# Patient Record
Sex: Female | Born: 1995 | Race: Black or African American | Hispanic: No | Marital: Single | Smoking: Never smoker
Health system: Southern US, Community
[De-identification: ages and names within clinical notes are randomized; demographics above are authoritative.]

## PROBLEM LIST (undated history)

## (undated) DIAGNOSIS — F419 Anxiety disorder, unspecified: Secondary | ICD-10-CM

## (undated) DIAGNOSIS — F909 Attention-deficit hyperactivity disorder, unspecified type: Secondary | ICD-10-CM

## (undated) HISTORY — PX: TYMPANOSTOMY TUBE PLACEMENT: SHX32

---
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
## 2017-02-01 ENCOUNTER — Ambulatory Visit (HOSPITAL_COMMUNITY)
Admission: EM | Admit: 2017-02-01 | Discharge: 2017-02-01 | Disposition: A | Discharge status: Home / Self Care | Payer: 59 | Attending: Internal Medicine | Admitting: Internal Medicine

## 2017-02-01 ENCOUNTER — Encounter (HOSPITAL_COMMUNITY): Payer: Self-pay | Admitting: Emergency Medicine

## 2017-02-01 DIAGNOSIS — M778 Other enthesopathies, not elsewhere classified: Secondary | ICD-10-CM

## 2017-02-01 MED ORDER — NAPROXEN 500 MG PO TABS: 500.0000 mg | ORAL_TABLET | Freq: Two times a day (BID) | ORAL | 0 refills | Status: DC

## 2017-02-01 NOTE — ED Provider Notes (Signed)
[facility]    CSN: [id number] Arrival date & time: 02/01/17  1715     History   Chief Complaint Chief Complaint  Patient presents with  . Arm Swelling    HPI Jody Singleton is a 21 y.o. female. She presents today with intermittent pain in the ventral ulnar aspect of her right wrist. Current episode has been going on for about a week. Pain is worse with wrist dorsiflexion, no real change with wrist flexion. Intermittently there seems to be some swelling over the ulnar tendons of the wrist area due to the wrist is painful if she rests the ventral surface on anything. She is right handed and tends to text with her right hand. 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 She has not noticed an association of time at work with pain in the wrist. She has tried 200 mg of ibuprofen every other day for her pain, alternating with a Tylenol every other day. She has also tried splinting, all without relief. No rash, no malaise, no fatigue. Feels well otherwise   HPI  Past Medical History:  Diagnosis Date  . Anxiety     Patient Active Problem List   Diagnosis Date Noted  . Pre-diabetes 09/03/2015    History reviewed. No pertinent surgical history.    Home Medications    Prior to Admission medications   Medication Sig Start Date End Date Taking? Authorizing Provider  modafinil (PROVIGIL) 100 MG tablet Take 100 mg by mouth daily.   Yes Historical Provider, MD  sertraline (ZOLOFT) 50 MG tablet Take 50 mg by mouth daily.   Yes Historical Provider, MD  naproxen (NAPROSYN) 500 MG tablet Take 1 tablet (500 mg total) by mouth 2 (two) times daily. 02/01/17   Eustace MooreLaura W Cyncere Ruhe, MD    Family History Family History  Problem Relation Age of Onset  . Kidney disease Mother   . Diabetes Father     Social History Social History  Substance Use Topics  . Smoking status: Never Smoker  . Smokeless tobacco: Never Used  . Alcohol use No     Allergies   Penicillins   Review of  Systems Review of Systems  All other systems reviewed and are negative.    Physical Exam Triage Vital Signs ED Triage Vitals  Enc Vitals Group     BP 02/01/17 1804 118/68     Pulse Rate 02/01/17 1804 90     Resp 02/01/17 1804 18     Temp 02/01/17 1804 98.3 F (36.8 C)     Temp Source 02/01/17 1804 Oral     SpO2 02/01/17 1804 100 %     Weight --      Height --      Pain Score 02/01/17 1803 6     Pain Loc --    Updated Vital Signs BP 118/68 (BP Location: Left Arm)   Pulse 90   Temp 98.3 F (36.8 C) (Oral)   Resp 18   SpO2 100%   Physical Exam  Constitutional: She is oriented to person, place, and time. No distress.  Alert, nicely groomed  HENT:  Head: Atraumatic.  Eyes:  Conjugate gaze, no eye redness/drainage  Neck: Neck supple.  Cardiovascular: Normal rate.   Pulmonary/Chest: No respiratory distress.  Abdominal: She exhibits no distension.  Musculoskeletal: Normal range of motion.  No leg swelling Good wrist range of motion, able to fully extend, flex, rotate her wrist, but with pain at dorsiflexion. Able to make a  tight fist and fully extend the fingers Tender to palpation over the ulnar flexor tendons at the wrist. No rash, no focal swelling/erythema/bruise.  Neurological: She is alert and oriented to person, place, and time.  Skin: Skin is warm and dry.  No cyanosis  Nursing note and vitals reviewed.    UC Treatments / Results   Procedures Procedures (including critical care time) None today  Final Clinical Impressions(s) / UC Diagnoses   Final diagnoses:  Right wrist tendinitis   Pain in left wrist seems most likely to be related to flexor tendinitis.  A prescription for naproxen (anti inflammatory, pain medicine) was sent to the [facility] on [address].  Try using ice for 5-10 minutes 2-4 times daily and avoid texting with the right hand.  Followup with Dr Duanne Guess in 2 weeks to discuss any further evaluation needed.    New Prescriptions New  Prescriptions   NAPROXEN (NAPROSYN) 500 MG TABLET    Take 1 tablet (500 mg total) by mouth 2 (two) times daily.     Eustace Moore, MD 02/03/17 2130

## 2017-02-01 NOTE — Discharge Instructions (Addendum)
Pain in left wrist seems most likely to be related to flexor tendinitis.  A prescription for naproxen (anti inflammatory, pain medicine) was sent to the [facility] on [address].  Try using ice for 5-10 minutes 2-4 times daily and avoid texting with the right hand.  Followup with Dr Duanne Guessewey in 2 weeks to discuss any further evaluation needed.

## 2017-02-01 NOTE — ED Triage Notes (Signed)
[facility] a [facility] right arm pain and swelling x 2 months. The patient denied any known injury.

## 2017-04-14 ENCOUNTER — Ambulatory Visit (INDEPENDENT_AMBULATORY_CARE_PROVIDER_SITE_OTHER): Payer: 59 | Admitting: Student

## 2017-04-14 ENCOUNTER — Encounter: Payer: Self-pay | Admitting: Student

## 2017-04-14 DIAGNOSIS — R319 Hematuria, unspecified: Secondary | ICD-10-CM | POA: Insufficient documentation

## 2017-04-14 DIAGNOSIS — R300 Dysuria: Secondary | ICD-10-CM | POA: Diagnosis not present

## 2017-04-14 LAB — POCT URINALYSIS DIP (MANUAL ENTRY)
BILIRUBIN UA: NEGATIVE
GLUCOSE UA: NEGATIVE mg/dL
Nitrite, UA: NEGATIVE
Protein Ur, POC: 100 mg/dL — AB
Spec Grav, UA: >=1.03 — AB (ref 1.010–1.025)
Urobilinogen, UA: 0.2 E.U./dL
pH, UA: 5.5 (ref 5.0–8.0)

## 2017-04-14 LAB — POC MICROSCOPIC URINALYSIS (UMFC): Mucus: ABSENT

## 2017-04-14 MED ORDER — NITROFURANTOIN MONOHYD MACRO 100 MG PO CAPS: 100.0000 mg | ORAL_CAPSULE | Freq: Two times a day (BID) | ORAL | 0 refills | Status: DC

## 2017-04-14 NOTE — Assessment & Plan Note (Addendum)
Could be vaginal source vs urinary source.  Has Nexplanon.  With symptoms and blood/LE in urine, will treat with macrobid and get urine culture.  Follow up if not improved and will consider making urology referral more urgent.  Will also do so if urine culture is negative.  Symptoms worsening.

## 2017-04-14 NOTE — Progress Notes (Signed)
   Subjective:    Patient ID: Jody Singleton, female    DOB: 02-13-1996, 21 y.o.   MRN: [id number]  HPI Presents with 1 month history of hematuria.  She did not notice this until a week or so ago.  Now she notices hematuria and dysuria occasionally.  She denies hesitancy or frequency.  Denies fevers or chills.  States that her mom had kidney failure while giving birth to her twins.  She is not sure the cause.  Pt is not sexually active.  She had labs drawn last visit and her PCP stated that her creatinine was elevated.  She is not concerned about STD's.     PMHx - Updated and reviewed.  Contributory factors include: depression PSHx - Updated and reviewed.  Contributory factors include:  Negative FHx - Updated and reviewed.  Contributory factors include:  Negative Social Hx - Updated and reviewed. Contributory factors include: Negative Medications - reviewed   Review of Systems  Constitutional: Negative for chills, fatigue and fever.  HENT: Negative for congestion and rhinorrhea.   Respiratory: Negative for cough, chest tightness and shortness of breath.   Cardiovascular: Negative for chest pain and leg swelling.  Gastrointestinal: Negative for abdominal pain and nausea.  Genitourinary: Positive for dysuria and hematuria. Negative for decreased urine volume, difficulty urinating, flank pain, frequency, pelvic pain, urgency, vaginal discharge and vaginal pain.  Musculoskeletal: Negative for arthralgias and joint swelling.  Skin: Negative for rash and wound.  Psychiatric/Behavioral: Negative for agitation and confusion.  All other systems reviewed and are negative.      Objective:   Physical Exam  Constitutional: She is oriented to person, place, and time. She appears well-developed and well-nourished. No distress.  HENT:  Head: Normocephalic and atraumatic.  Right Ear: External ear normal.  Left Ear: External ear normal.  Neck: Normal range of motion. Neck supple.  Pulmonary/Chest:  Effort normal. No respiratory distress.  Abdominal: Soft. Bowel sounds are normal. She exhibits no distension. There is no tenderness. There is no rebound and no guarding.  Genitourinary: Vagina normal. No vaginal discharge found.  Genitourinary Comments: No bleeding in vaginal vault  Musculoskeletal: Normal range of motion. She exhibits no edema.  Lymphadenopathy:    She has no cervical adenopathy.  Neurological: She is alert and oriented to person, place, and time.  Skin: Skin is warm. No rash noted. She is not diaphoretic. No erythema.  Psychiatric: She has a normal mood and affect. Her behavior is normal. Judgment and thought content normal.  Nursing note and vitals reviewed.   BP 121/75   Pulse 98   Temp 98 F (36.7 C) (Oral)   Resp 17   Ht 5\' 4"  (1.626 m)   Wt 243 lb (110.2 kg)   LMP 03/31/2017 (Approximate)   SpO2 97%   BMI 41.71 kg/m        Assessment & Plan:  Hematuria Could be vaginal source vs urinary source.  Has Nexplanon.  With symptoms and blood/LE in urine, will treat with macrobid and get urine culture.  Follow up if not improved and will consider making urology referral more urgent.  Will also do so if urine culture is negative.  Symptoms worsening.  Signed,  Corliss MarcusAlicia Barnes, DO [facility] 6:07 PM 04/14/17

## 2017-04-15 LAB — COMPREHENSIVE METABOLIC PANEL
ALT: 17 IU/L (ref 0–32)
AST: 30 IU/L (ref 0–40)
Albumin/Globulin Ratio: 1.3 (ref 1.2–2.2)
Albumin: 4.2 g/dL (ref 3.5–5.5)
Alkaline Phosphatase: 55 IU/L (ref 39–117)
BILIRUBIN TOTAL: 0.5 mg/dL (ref 0.0–1.2)
BUN / CREAT RATIO: 16 (ref 9–23)
BUN: 14 mg/dL (ref 6–20)
CALCIUM: 9 mg/dL (ref 8.7–10.2)
CHLORIDE: 91 mmol/L — AB (ref 96–106)
CO2: 22 mmol/L (ref 18–29)
Creatinine, Ser: 0.86 mg/dL (ref 0.57–1.00)
GFR, EST AFRICAN AMERICAN: 113 mL/min/{1.73_m2} (ref >59)
GFR, EST NON AFRICAN AMERICAN: 98 mL/min/{1.73_m2} (ref >59)
GLOBULIN, TOTAL: 3.2 g/dL (ref 1.5–4.5)
Glucose: 94 mg/dL (ref 65–99)
Potassium: 3.7 mmol/L (ref 3.5–5.2)
SODIUM: 132 mmol/L — AB (ref 134–144)
TOTAL PROTEIN: 7.4 g/dL (ref 6.0–8.5)

## 2017-04-15 LAB — CBC WITH DIFFERENTIAL/PLATELET
Basophils Absolute: 0 10*3/uL (ref 0.0–0.2)
Basos: 0 %
EOS (ABSOLUTE): 0.2 10*3/uL (ref 0.0–0.4)
Eos: 2 %
HEMATOCRIT: 38 % (ref 34.0–46.6)
Hemoglobin: 12.3 g/dL (ref 11.1–15.9)
IMMATURE GRANS (ABS): 0 10*3/uL (ref 0.0–0.1)
IMMATURE GRANULOCYTES: 0 %
Lymphocytes Absolute: 2.6 10*3/uL (ref 0.7–3.1)
Lymphs: 26 %
MCH: 24.3 pg — AB (ref 26.6–33.0)
MCHC: 32.4 g/dL (ref 31.5–35.7)
MCV: 75 fL — AB (ref 79–97)
MONOS ABS: 0.6 10*3/uL (ref 0.1–0.9)
Monocytes: 6 %
NEUTROS ABS: 6.6 10*3/uL (ref 1.4–7.0)
NEUTROS PCT: 66 %
PLATELETS: 389 10*3/uL — AB (ref 150–379)
RBC: 5.06 x10E6/uL (ref 3.77–5.28)
RDW: 16.2 % — ABNORMAL HIGH (ref 12.3–15.4)
WBC: 10 10*3/uL (ref 3.4–10.8)

## 2017-04-16 LAB — URINE CULTURE

## 2017-04-21 ENCOUNTER — Telehealth: Payer: Self-pay | Admitting: Student

## 2017-04-21 ENCOUNTER — Telehealth: Payer: Self-pay | Admitting: Family Medicine

## 2017-04-21 DIAGNOSIS — R310 Gross hematuria: Secondary | ICD-10-CM

## 2017-04-21 NOTE — Telephone Encounter (Signed)
Please see my phone note- Urgent urology referral, no UTI.  No abnormal kidney function.

## 2017-04-21 NOTE — Telephone Encounter (Signed)
Pt calling Dr Rory Percyhitanand back about labs

## 2017-04-21 NOTE — Telephone Encounter (Signed)
Left VM for patient. If she calls back please have her speak with a nurse/CMA and tell her that her lab/urine results do not show a UTI and do not show kidney disorders.  She is not anemic at this time.  I will place a more urgent urology referral since this has become worse and it was not a UTI.     If any questions then please take the best time and phone number to call and I will try to call her back.   Glenard Haringhitanand, Alicia B, DO PGY-4, [facility] 04/21/2017, 11:42 AM

## 2017-04-21 NOTE — Telephone Encounter (Signed)
Please advise 

## 2017-04-22 NOTE — Telephone Encounter (Signed)
Left VM for patient. If she calls back please have her speak with a nurse/CMA and tell her that her lab/urine results do not show a UTI and do not show kidney disorders.  She is not anemic at this time.  I will place a more urgent urology referral since this has become worse and it was not a UTI.     If any questions then please take the best time and phone number to call and I will try to call her back.   Glenard Haringhitanand, Alicia B, DO PGY-4, [facility] 04/21/2017, 11:42 AM   L/m with dr advice and referral info

## 2017-04-22 NOTE — Telephone Encounter (Signed)
Urgent Urology referral sent on 5/23 to [facility] per provider's request- okay to send per Clydie BraunKaren from PCP

## 2018-07-24 ENCOUNTER — Ambulatory Visit: Payer: 59 | Admitting: Osteopathic Medicine

## 2018-07-24 ENCOUNTER — Encounter: Payer: Self-pay | Admitting: Osteopathic Medicine

## 2018-07-24 ENCOUNTER — Other Ambulatory Visit: Payer: Self-pay

## 2018-07-24 VITALS — BP 135/89 | HR 89 | Temp 98.0°F | Resp 16 | Ht 63.98 in | Wt 236.0 lb

## 2018-07-24 DIAGNOSIS — J029 Acute pharyngitis, unspecified: Secondary | ICD-10-CM | POA: Diagnosis not present

## 2018-07-24 MED ORDER — PREDNISONE 20 MG PO TABS: 20.0000 mg | ORAL_TABLET | Freq: Two times a day (BID) | ORAL | 0 refills | Status: DC

## 2018-07-24 MED ORDER — LIDOCAINE VISCOUS HCL 2 % MT SOLN: 15.0000 mL | OROMUCOSAL | 1 refills | Status: DC | PRN

## 2018-07-24 NOTE — Progress Notes (Signed)
HPI: Jody Singleton is a 22 y.o. female who  has a past medical history of Anxiety.  [facility], 07/24/18,  for chief complaint of: Sore throat  . Location/Quality: throat, sore. Hurts a little to swallow . Duration: 1 week  . Modifying factors: DayQuil not helping  . Assoc signs/symptoms: no fever/cihlls, sinus pressure, cough  Context: started after oral sex, w/ 2 moments of deeper penetration.      Past medical, surgical, social and family history reviewed:  Patient Active Problem List   Diagnosis Date Noted  . Hematuria 04/14/2017  . Pre-diabetes 09/03/2015    History reviewed. No pertinent surgical history.  Social History   Tobacco Use  . Smoking status: Never Smoker  . Smokeless tobacco: Never Used  Substance Use Topics  . Alcohol use: No    Alcohol/week: 0.0 standard drinks    Family History  Problem Relation Age of Onset  . Kidney disease Mother   . Diabetes Father      Current medication list and allergy/intolerance information reviewed:    No current outpatient medications on file.   No current facility-administered medications for this visit.     Allergies  Allergen Reactions  . Penicillins Hives          Review of Systems:  Constitutional:  No  fever, no chills, No recent illness, No unintentional weight changes. No significant fatigue.   HEENT: No  headache, no vision change, no hearing change, +sore throat, No  sinus pressure  Cardiac: No  chest pain, No  pressure, No palpitations  Respiratory:  No  shortness of breath. No  Cough  Gastrointestinal: No  abdominal pain, No  nausea, No  vomiting,  No  blood in stool, No  diarrhea  Musculoskeletal: No new myalgia/arthralgia  Skin: No  Rash  Neurologic: No  weakness, No  dizziness  Exam:  BP 135/89   Pulse 89   Temp 98 F (36.7 C) (Oral)   Resp 16   Ht 5' 3.98" (1.625 m)   Wt 236 lb (107 kg)   SpO2 97%   BMI 40.54 kg/m    Constitutional: VS see above. General Appearance: alert, well-developed, well-nourished, NAD  Eyes: Normal lids and conjunctive, non-icteric sclera  Ears, Nose, Mouth, Throat: MMM, Normal external inspection ears/nares/mouth/lips/gums. TM normal bilaterally. Pharynx/tonsils no erythema, no exudate. Nasal mucosa normal.   Neck: No masses, trachea midline. No tenderness/mass appreciated. No lymphadenopathy  Respiratory: Normal respiratory effort. no wheeze, no rhonchi, no rales  Cardiovascular: S1/S2 normal, no murmur, no rub/gallop auscultated. RRR. No lower extremity edema.   Gastrointestinal: Nontender, no masses. Bowel sounds normal.  Musculoskeletal: Gait normal.   Neurological: Normal balance/coordination. No tremor.   Skin: warm, dry, intact.   Psychiatric: Normal judgment/insight. Normal mood and affect.    ASSESSMENT/PLAN:  Sore throat   Likely traumatic, see HPI.  Discussed safe sexual practices, consent, ensured safety.  Declines STI testing right now, advised RTC for this if no better or if other concerning symptoms.      Visit summary with medication list and pertinent instructions was printed for patient to review. All questions at time of visit were answered - patient instructed to contact office with any additional concerns. ER/RTC precautions were reviewed with the patient.   Follow-up plan: Return for recheck or further testing if not better in the next week or so, sooner if worse/change .    Please note: voice recognition software was used  to produce this document, and typos may escape review. Please contact Dr. Lyn Hollingshead for any needed clarifications.

## 2018-07-24 NOTE — Patient Instructions (Signed)
° ° ° °  If you have lab work done today you will be contacted with your lab results within the next 2 weeks.  If you have not heard from us then please contact us. The fastest way to get your results is to register for My Chart. ° ° °IF you received an x-ray today, you will receive an invoice from [facility]. Please contact [facility] at [phone] with questions or concerns regarding your invoice.  ° °IF you received labwork today, you will receive an invoice from [facility]. Please contact [facility] at [phone] with questions or concerns regarding your invoice.  ° °Our billing staff will not be able to assist you with questions regarding bills from these companies. ° °You will be contacted with the lab results as soon as they are available. The fastest way to get your results is to activate your My Chart account. Instructions are located on the last page of this paperwork. If you have not heard from us regarding the results in 2 weeks, please contact this office. °  ° ° ° °

## 2018-08-01 ENCOUNTER — Encounter (HOSPITAL_COMMUNITY): Payer: Self-pay

## 2018-08-01 ENCOUNTER — Ambulatory Visit (HOSPITAL_COMMUNITY)
Admission: EM | Admit: 2018-08-01 | Discharge: 2018-08-01 | Disposition: A | Discharge status: Home / Self Care | Payer: 59 | Attending: Family Medicine | Admitting: Family Medicine

## 2018-08-01 ENCOUNTER — Other Ambulatory Visit: Payer: Self-pay

## 2018-08-01 DIAGNOSIS — J039 Acute tonsillitis, unspecified: Secondary | ICD-10-CM | POA: Insufficient documentation

## 2018-08-01 DIAGNOSIS — J029 Acute pharyngitis, unspecified: Secondary | ICD-10-CM | POA: Diagnosis present

## 2018-08-01 LAB — POCT RAPID STREP A: Streptococcus, Group A Screen (Direct): NEGATIVE

## 2018-08-01 MED ORDER — NAPROXEN 375 MG PO TABS: 375.0000 mg | ORAL_TABLET | Freq: Two times a day (BID) | ORAL | 0 refills | Status: DC

## 2018-08-01 NOTE — Discharge Instructions (Addendum)
Decline STI testing at this time Continue with viscous lidocaine Naproxen prescribed.  Take as needed for symptomatic relief Follow up with [facility] or with PCP this week for further evaluation and management of symptoms Return or go to ER if patient has any new or worsening symptoms

## 2018-08-01 NOTE — ED Notes (Signed)
Bed: UC04 Expected date:  Expected time:  Means of arrival:  Comments: Hold for 405 appt

## 2018-08-01 NOTE — ED Provider Notes (Signed)
[facility]   [id number] 08/01/18 Arrival Time: 1558  [id number] THROAT  SUBJECTIVE: History from: patient.  Jody Singleton is a 22 y.o. female who presents with sore throat x 2 weeks.  Symptoms began after performing oral sex  Was seen at [facility] and treated with prednisone and lidocaine.  Was instructed to follow up with [facility] if symptoms did not improve.  Denies improvement in symptoms with treatment.  Denies aggravating factors  Patient complains of constant and dull pain.  Denies previous symptoms in the past.  Complains of fatigue, and dysphagia, but tolerating liquids and own secretions without difficulty.  Denies fever, chills, sinus pain, rhinorrhea, cough, SOB, wheezing, chest pain, nausea, rash, changes in bowel or bladder habits.     ROS: As per HPI.  Past Medical History:  Diagnosis Date  . Anxiety    History reviewed. No pertinent surgical history. Allergies  Allergen Reactions  . Penicillins Hives        No current facility-administered medications on file prior to encounter.    Current Outpatient Medications on File Prior to Encounter  Medication Sig Dispense Refill  . lidocaine (XYLOCAINE) 2 % solution Use as directed 15 mLs in the mouth or throat every 3 (three) hours as needed for mouth pain (throat pain). 100 mL 1  . predniSONE (DELTASONE) 20 MG tablet Take 1 tablet (20 mg total) by mouth 2 (two) times daily with a meal. 10 tablet 0   Social History   Socioeconomic History  . Marital status: Single    Spouse name: Not on file  . Number of children: Not on file  . Years of education: Not on file  . Highest education level: Not on file  Occupational History  . Not on file  Social Needs  . Financial resource strain: Not on file  . Food insecurity:    Worry: Not on file    Inability: Not on file  . Transportation needs:    Medical: Not on file    Non-medical: Not on file  Tobacco Use  . Smoking status: Never Smoker  . Smokeless tobacco:  Never Used  Substance and Sexual Activity  . Alcohol use: No    Alcohol/week: 0.0 standard drinks  . Drug use: No  . Sexual activity: Never  Lifestyle  . Physical activity:    Days per week: Not on file    Minutes per session: Not on file  . Stress: Not on file  Relationships  . Social connections:    Talks on phone: Not on file    Gets together: Not on file    Attends religious service: Not on file    Active member of club or organization: Not on file    Attends meetings of clubs or organizations: Not on file    Relationship status: Not on file  . Intimate partner violence:    Fear of current or ex partner: Not on file    Emotionally abused: Not on file    Physically abused: Not on file    Forced sexual activity: Not on file  Other Topics Concern  . Not on file  Social History Narrative  . Not on file   Family History  Problem Relation Age of Onset  . Kidney disease Mother   . Diabetes Father     OBJECTIVE:  Vitals:   08/01/18 1609  BP: 129/85  Pulse: 90  Resp: 18  Temp: 97.9 F (36.6 C)  SpO2: 98%     General  appearance: alert; nontoxic appearance; using mobile device without difficulty HEENT: Ears: EACs clear, TMs pearly gray with visible cone of light, without erythema; Eyes: PERRL, EOMI grossly; Nose: no clear rhinorrhea; Throat: tonsils 2-3+ non-erythematous without white tonsillar exudates, uvula midline Neck: supple without LAD Lungs: CTA bilaterally without adventitious breath sounds Heart: regular rate and rhythm.  Radial pulses 2+ symmetrical bilaterally Skin: warm and dry Psychological: alert and cooperative; normal mood and affect  ASSESSMENT & PLAN:  1. Tonsillitis   2. Sore throat     Meds ordered this encounter  Medications  . naproxen (NAPROSYN) 375 MG tablet    Sig: Take 1 tablet (375 mg total) by mouth 2 (two) times daily.    Dispense:  20 tablet    Refill:  0    Order Specific Question:   Supervising Provider    Answer:   Isa Rankin [[id number]]    Decline STI testing at this time Continue with viscous lidocaine Naproxen prescribed.  Take as needed for symptomatic relief Follow up with [facility] this week for further evaluation and management of symptoms Return or go to ER if patient has any new or worsening symptoms   Reviewed expectations re: course of current medical issues. Questions answered. Outlined signs and symptoms indicating need for more acute intervention. Patient verbalized understanding. After Visit Summary given.        Jody Harding, PA-C 08/01/18 1637

## 2018-08-01 NOTE — ED Triage Notes (Signed)
Pt states she has a sore throat x 2 weeks.

## 2018-08-04 LAB — CULTURE, GROUP A STREP (THRC)

## 2019-10-06 ENCOUNTER — Ambulatory Visit (INDEPENDENT_AMBULATORY_CARE_PROVIDER_SITE_OTHER): Payer: 59 | Admitting: Obstetrics and Gynecology

## 2019-10-06 ENCOUNTER — Other Ambulatory Visit: Payer: Self-pay

## 2019-10-06 ENCOUNTER — Encounter: Payer: Self-pay | Admitting: Obstetrics and Gynecology

## 2019-10-06 VITALS — BP 125/85 | HR 99 | Temp 98.6°F | Ht 63.0 in | Wt 250.6 lb

## 2019-10-06 DIAGNOSIS — Z3046 Encounter for surveillance of implantable subdermal contraceptive: Secondary | ICD-10-CM

## 2019-10-06 NOTE — Progress Notes (Signed)
Ms. EARLISHA SHARPLES is a No obstetric history on file. female in the office today for removal of Nexplanon; which was inserted 2017 at unknown GYN office in [place], [place]. She does not desire to have it replaced today. She reports not being sexually active in 3 yrs. She plans to use condoms, if she resumes sexual activity. She has no complaints today.  BP 125/85 (BP Location: Right Arm, Patient Position: Sitting, Cuff Size: Large)   Pulse 99   Temp 98.6 F (37 C) (Oral)   Ht 5\' 3"  (1.6 m)   Wt 250 lb 9.6 oz (113.7 kg)   LMP 09/19/2019 (Exact Date)   BMI 44.39 kg/m    Procedure Note: Consent obtained and Time-Out conducted Unable to palpate Implant in left upper arm Informal U/S by CNM to attempt to locate Nexplanon location in arm - unsuccessful attempt  Assessment and Plan: Encounter for Nexplanon removal - Unable to locate Nexplanon - Consult with Dr. Sherryl Barters -- advised to order limited soft tissue u/s to find implant and add a note to have site marked with a Sharpie - Nexplanon removal appt scheduled at [place] with MD - U/S to be scheduled before removal appt on same day, if possible  Time spent with patient 10 minutes.  Laury Deep, CNM  10/06/2019 9:28 AM

## 2019-10-10 ENCOUNTER — Telehealth: Payer: Self-pay | Admitting: General Practice

## 2019-10-10 NOTE — Telephone Encounter (Signed)
Pt scheduled for [place] on 10/24/2019 at 8:00am.  Left message on VM with appt date and time of appt.  Asked pt to give our office a call.

## 2019-10-24 ENCOUNTER — Other Ambulatory Visit: Payer: Self-pay

## 2019-10-24 ENCOUNTER — Ambulatory Visit: Payer: 59 | Admitting: Obstetrics and Gynecology

## 2019-10-24 ENCOUNTER — Encounter: Payer: Self-pay | Admitting: Obstetrics and Gynecology

## 2019-10-24 ENCOUNTER — Ambulatory Visit (HOSPITAL_COMMUNITY)
Admission: RE | Admit: 2019-10-24 | Discharge: 2019-10-24 | Disposition: A | Discharge status: Home / Self Care | Payer: 59 | Source: Ambulatory Visit | Attending: Obstetrics and Gynecology | Admitting: Obstetrics and Gynecology

## 2019-10-24 VITALS — BP 123/82 | HR 91 | Wt 253.8 lb

## 2019-10-24 DIAGNOSIS — Z3046 Encounter for surveillance of implantable subdermal contraceptive: Secondary | ICD-10-CM

## 2019-10-24 NOTE — Procedures (Signed)
Nexplanon Removal Procedure Note Prior to the procedure being performed, the patient (or guardian) was asked to state their full name, date of birth, type of procedure being performed and the exact location of the operative site. This information was then checked against the documentation in the patient's chart. Prior to the procedure being performed, a "time out" was performed by the physician that confirmed the correct patient, procedure and site.  After informed consent was obtained, the patient's left arm was examined. The area was marked from her u/s earlier today. The distal tip was palpable with pressure.The area was cleaned with alcohol then local anesthesia was infiltrated with 3 ml of 1% lidocaine with epi. The area was prepped with betadine. Using sterile technique, the Nexplanon device was brought to the incision site. The capsule was scrapped off with the scalpel, the Nexplanon grasped with hemostats, and easily removed; the removal site was hemostatic. The Nexplanon was inspected and noted to be intact.  A steri-strip and a pressure dressing was applied.  The patient tolerated the procedure well.  She chose to do abstinence for birth control.   Durene Romans MD Attending Center for [employer] Fish farm manager)

## 2019-11-01 ENCOUNTER — Encounter: Payer: Self-pay | Admitting: *Deleted

## 2019-12-13 ENCOUNTER — Ambulatory Visit: Payer: 59 | Attending: Internal Medicine

## 2019-12-13 DIAGNOSIS — Z20822 Contact with and (suspected) exposure to covid-19: Secondary | ICD-10-CM

## 2019-12-14 ENCOUNTER — Other Ambulatory Visit: Payer: Self-pay

## 2019-12-14 ENCOUNTER — Ambulatory Visit
Admission: EM | Admit: 2019-12-14 | Discharge: 2019-12-14 | Disposition: A | Discharge status: Home / Self Care | Payer: 59 | Attending: Physician Assistant | Admitting: Physician Assistant

## 2019-12-14 DIAGNOSIS — R110 Nausea: Secondary | ICD-10-CM | POA: Diagnosis not present

## 2019-12-14 DIAGNOSIS — R52 Pain, unspecified: Secondary | ICD-10-CM | POA: Diagnosis not present

## 2019-12-14 DIAGNOSIS — Z0189 Encounter for other specified special examinations: Secondary | ICD-10-CM

## 2019-12-14 DIAGNOSIS — J029 Acute pharyngitis, unspecified: Secondary | ICD-10-CM

## 2019-12-14 DIAGNOSIS — Z3202 Encounter for pregnancy test, result negative: Secondary | ICD-10-CM

## 2019-12-14 LAB — POCT URINE PREGNANCY: Preg Test, Ur: NEGATIVE

## 2019-12-14 LAB — NOVEL CORONAVIRUS, NAA: SARS-CoV-2, NAA: NOT DETECTED

## 2019-12-14 MED ORDER — LIDOCAINE VISCOUS HCL 2 % MT SOLN: OROMUCOSAL | 0 refills | Status: DC

## 2019-12-14 MED ORDER — ONDANSETRON 4 MG PO TBDP: 4.0000 mg | ORAL_TABLET | Freq: Three times a day (TID) | ORAL | 0 refills | Status: AC | PRN

## 2019-12-14 NOTE — ED Triage Notes (Signed)
Pt presents with complaints of headache, fatigue, nausea, sore throat and body aches x 1 week. Denies any exposure to covid. Denies any other symptoms. Reports minimal relief with otc medications.

## 2019-12-14 NOTE — ED Provider Notes (Signed)
[facility]    CSN: [id number] Arrival date & time: 12/14/19  1620      History   Chief Complaint Chief Complaint  Patient presents with  . Appointment  . Fatigue    HPI Jody Singleton is a 24 y.o. female.   24 year old female comes in for 7 day of URI symptoms. Has had headache, fatigue, nausea, sore throat, body aches. Minimal cough that has resolved. no rhinorrhea, nasal congestion. Denies fever, chills. Nausea without vomiting. Denies abdominal pain, diarrhea. Denies shortness of breath, loss of taste/smell. No obvious sick contact. She would like pregnancy test and COVID test.      Past Medical History:  Diagnosis Date  . Anxiety     Patient Active Problem List   Diagnosis Date Noted  . Hematuria 04/14/2017  . Pre-diabetes 09/03/2015    History reviewed. No pertinent surgical history.  OB History   No obstetric history on file.      Home Medications    Prior to Admission medications   Medication Sig Start Date End Date Taking? Authorizing Provider  lidocaine (XYLOCAINE) 2 % solution 5-15 mL gurgle as needed 12/14/19   Tasia Catchings, Annalyssa Thune V, PA-C  ondansetron (ZOFRAN ODT) 4 MG disintegrating tablet Take 1 tablet (4 mg total) by mouth every 8 (eight) hours as needed for nausea or vomiting. 12/14/19   Ok Edwards, PA-C    Family History Family History  Problem Relation Age of Onset  . Kidney disease Mother   . Diabetes Father     Social History Social History   Tobacco Use  . Smoking status: Never Smoker  . Smokeless tobacco: Never Used  Substance Use Topics  . Alcohol use: No    Alcohol/week: 0.0 standard drinks  . Drug use: No     Allergies   Penicillins   Review of Systems Review of Systems  Reason unable to perform ROS: See HPI as above.     Physical Exam Triage Vital Signs ED Triage Vitals [12/14/19 1652]  Enc Vitals Group     BP      Pulse      Resp      Temp      Temp src      SpO2      Weight      Height      Head  Circumference      Peak Flow      Pain Score 7     Pain Loc      Pain Edu?      Excl. in [place]?    No data found.  Updated Vital Signs BP 103/71   Pulse 90   Temp 98.2 F (36.8 C)   Resp 18   SpO2 98%   Physical Exam Constitutional:      General: She is not in acute distress.    Appearance: Normal appearance. She is not ill-appearing, toxic-appearing or diaphoretic.  HENT:     Head: Normocephalic and atraumatic.     Mouth/Throat:     Mouth: Mucous membranes are moist.     Pharynx: Oropharynx is clear. Uvula midline.  Cardiovascular:     Rate and Rhythm: Normal rate and regular rhythm.     Heart sounds: Normal heart sounds. No murmur. No friction rub. No gallop.   Pulmonary:     Effort: Pulmonary effort is normal. No accessory muscle usage, prolonged expiration, respiratory distress or retractions.     Comments: Lungs clear to auscultation  without adventitious lung sounds. Musculoskeletal:     Cervical back: Normal range of motion and neck supple.  Skin:    General: Skin is warm and dry.  Neurological:     General: No focal deficit present.     Mental Status: She is alert and oriented to person, place, and time.      UC Treatments / Results  Labs (all labs ordered are listed, but only abnormal results are displayed) Labs Reviewed  NOVEL CORONAVIRUS, NAA  POCT URINE PREGNANCY    EKG   Radiology No results found.  Procedures Procedures (including critical care time)  Medications Ordered in UC Medications - No data to display  Initial Impression / Assessment and Plan / UC Course  I have reviewed the triage vital signs and the nursing notes.  Pertinent labs & imaging results that were available during my care of the patient were reviewed by me and considered in my medical decision making (see chart for details).    COVID testing ordered and collected prior to provider evaluation. Upon chart review, patient has had 2 COVID tests, one rapid, one PCR done  12/13/2019. Both were negative. Will not retest at this time. Order was unable to be canceled on Epic. Sample NOT sent out.  Will provide pregnancy testing per patient's request. nexplanon removed 10/2019, has not had cycle. Pregnancy test negative. Will provide symptomatic treatment for now and to continue to monitor. Return precautions given.  Final Clinical Impressions(s) / UC Diagnoses   Final diagnoses:  Patient request for diagnostic testing  Sore throat  Body aches  Nausea without vomiting   ED Prescriptions    Medication Sig Dispense Auth. Provider   lidocaine (XYLOCAINE) 2 % solution 5-15 mL gurgle as needed 150 mL Tyteanna Ost V, PA-C   ondansetron (ZOFRAN ODT) 4 MG disintegrating tablet Take 1 tablet (4 mg total) by mouth every 8 (eight) hours as needed for nausea or vomiting. 10 tablet Belinda Fisher, PA-C     PDMP not reviewed this encounter.   Belinda Fisher, PA-C 12/14/19 1808

## 2019-12-14 NOTE — Discharge Instructions (Addendum)
Urine pregnancy negative. Continue to monitor symptoms. Start lidocaine for sore throat, do not eat or drink for the next 40 mins after use as it can stunt your gag reflex. Zofran as needed. Tylenol/motrin as needed. Follow up with PCP if symptoms not improving

## 2020-05-05 ENCOUNTER — Other Ambulatory Visit: Payer: Self-pay

## 2020-05-05 ENCOUNTER — Ambulatory Visit
Admission: EM | Admit: 2020-05-05 | Discharge: 2020-05-05 | Disposition: A | Discharge status: Home / Self Care | Payer: 59 | Attending: Emergency Medicine | Admitting: Emergency Medicine

## 2020-05-05 DIAGNOSIS — L02411 Cutaneous abscess of right axilla: Secondary | ICD-10-CM

## 2020-05-05 MED ORDER — DOXYCYCLINE HYCLATE 100 MG PO CAPS: 100.0000 mg | ORAL_CAPSULE | Freq: Two times a day (BID) | ORAL | 0 refills | Status: DC

## 2020-05-05 NOTE — ED Triage Notes (Signed)
Pt presents with abscess under right axilla, that has grown over past week

## 2020-05-05 NOTE — Discharge Instructions (Addendum)
Keep dry and covered to avoid friction  You may then wash site daily with warm water and mild soap Take antibiotic as prescribed and to completion Return sooner or go to the ED if you have any new or worsening symptoms such as increased redness, swelling, pain, nausea, vomiting, fever, chills, etc.

## 2020-05-05 NOTE — ED Provider Notes (Signed)
[facility]   [id number] 05/05/20 Arrival Time: 1227   [id number]  SUBJECTIVE:  Jody Singleton is a 24 y.o. female who presents with a possible abscess of her right axilla. Onset gradual, approximately 1 week ago.  Denies any precipitating event.  Denies alleviating or aggravating factor.  Has not tried any OTC medication.  Denies chills, fever, nausea, vomiting, diarrhea.  ROS: As per HPI.  All other pertinent ROS negative.     Past Medical History:  Diagnosis Date  . Anxiety    No past surgical history on file. Allergies  Allergen Reactions  . Penicillins Hives        No current facility-administered medications on file prior to encounter.   Current Outpatient Medications on File Prior to Encounter  Medication Sig Dispense Refill  . lidocaine (XYLOCAINE) 2 % solution 5-15 mL gurgle as needed 150 mL 0  . ondansetron (ZOFRAN ODT) 4 MG disintegrating tablet Take 1 tablet (4 mg total) by mouth every 8 (eight) hours as needed for nausea or vomiting. 10 tablet 0   Social History   Socioeconomic History  . Marital status: Single    Spouse name: Not on file  . Number of children: Not on file  . Years of education: Not on file  . Highest education level: Not on file  Occupational History  . Not on file  Tobacco Use  . Smoking status: Never Smoker  . Smokeless tobacco: Never Used  Substance and Sexual Activity  . Alcohol use: No    Alcohol/week: 0.0 standard drinks  . Drug use: No  . Sexual activity: Never  Other Topics Concern  . Not on file  Social History Narrative  . Not on file   Social Determinants of Health   Financial Resource Strain:   . Difficulty of Paying Living Expenses:   Food Insecurity:   . Worried About Programme researcher, broadcasting/film/video in the Last Year:   . Barista in the Last Year:   Transportation Needs:   . Freight forwarder (Medical):   Marland Kitchen Lack of Transportation (Non-Medical):   Physical Activity:   . Days of Exercise per Week:    . Minutes of Exercise per Session:   Stress:   . Feeling of Stress :   Social Connections:   . Frequency of Communication with Friends and Family:   . Frequency of Social Gatherings with Friends and Family:   . Attends Religious Services:   . Active Member of Clubs or Organizations:   . Attends Banker Meetings:   Marland Kitchen Marital Status:   Intimate Partner Violence:   . Fear of Current or Ex-Partner:   . Emotionally Abused:   Marland Kitchen Physically Abused:   . Sexually Abused:    Family History  Problem Relation Age of Onset  . Kidney disease Mother   . Diabetes Father     OBJECTIVE:  Vitals:   05/05/20 1234  BP: 112/82  Pulse: 81  Resp: 20  Temp: 98.1 F (36.7 C)  SpO2: 98%     General appearance: alert; no distress Skin:4 cm induration of her right axilla; tender to touch; no active drainage Psychological: alert and cooperative; normal mood and affect  Procedure: Verbal consent obtained. Area over induration cleaned with betadine. Lidocaine 2% with epinephrine used to obtain local anesthesia. The most fluctuant portion of the abscess was incised with a #11 blade scalpel. Abscess cavity explored and evacuated. Loculations broken up with a curved hemostat as best  as possible given patient discomfort. Cavity packed with packing material and dressed with a clean gauze dressing. Minimal bleeding. No complications.  ASSESSMENT & PLAN:  1. Abscess of axilla, right     Meds ordered this encounter  Medications  . doxycycline (VIBRAMYCIN) 100 MG capsule    Sig: Take 1 capsule (100 mg total) by mouth 2 (two) times daily.    Dispense:  20 capsule    Refill:  0    Discharge instructions.Marland Kitchen    Keep dry and covered to avoid friction  You may then wash site daily with warm water and mild soap Take antibiotic as prescribed and to completion Return sooner or go to the ED if you have any new or worsening symptoms such as increased redness, swelling, pain, nausea, vomiting,  fever, chills, etc...   Reviewed expectations re: course of current medical issues. Questions answered. Outlined signs and symptoms indicating need for more acute intervention. Patient verbalized understanding. After Visit Summary given.          Emerson Monte, FNP 05/05/20 1306

## 2021-09-20 ENCOUNTER — Other Ambulatory Visit: Payer: Self-pay

## 2021-09-20 ENCOUNTER — Ambulatory Visit (HOSPITAL_COMMUNITY): Admit: 2021-09-20 | Discharge status: Absent without leave | Payer: 59

## 2021-09-20 ENCOUNTER — Ambulatory Visit
Admission: EM | Admit: 2021-09-20 | Discharge: 2021-09-20 | Disposition: A | Discharge status: Home / Self Care | Payer: 59 | Attending: Internal Medicine | Admitting: Internal Medicine

## 2021-09-20 DIAGNOSIS — J069 Acute upper respiratory infection, unspecified: Secondary | ICD-10-CM | POA: Insufficient documentation

## 2021-09-20 DIAGNOSIS — Z20822 Contact with and (suspected) exposure to covid-19: Secondary | ICD-10-CM | POA: Insufficient documentation

## 2021-09-20 DIAGNOSIS — J029 Acute pharyngitis, unspecified: Secondary | ICD-10-CM | POA: Insufficient documentation

## 2021-09-20 LAB — POCT RAPID STREP A (OFFICE): Rapid Strep A Screen: NEGATIVE

## 2021-09-20 LAB — POCT INFLUENZA A/B
Influenza A, POC: NEGATIVE
Influenza B, POC: NEGATIVE

## 2021-09-20 MED ORDER — FLUTICASONE PROPIONATE 50 MCG/ACT NA SUSP: 1.0000 | Freq: Every day | NASAL | 0 refills | Status: DC

## 2021-09-20 MED ORDER — DM-GUAIFENESIN ER 30-600 MG PO TB12: 1.0000 | ORAL_TABLET | Freq: Two times a day (BID) | ORAL | 0 refills | Status: AC

## 2021-09-20 MED ORDER — CETIRIZINE HCL 10 MG PO TABS: 10.0000 mg | ORAL_TABLET | Freq: Every day | ORAL | 0 refills | Status: AC

## 2021-09-20 NOTE — ED Provider Notes (Signed)
[facility]    CSN: [id number] Arrival date & time: 09/20/21  0955      History   Chief Complaint Chief Complaint  Patient presents with   Cough    HPI Jody Singleton is a 25 y.o. female.   Patient presents with 2-day history of cough, chills, fever, body aches, nasal congestion, sore throat.  T-max at home was 101.  Patient denies any known sick contacts.  Patient has taken Tylenol for symptoms with minimal improvement.  Denies chest pain or shortness of breath.   Cough  Past Medical History:  Diagnosis Date   Anxiety     Patient Active Problem List   Diagnosis Date Noted   Hematuria 04/14/2017   Pre-diabetes 09/03/2015    History reviewed. No pertinent surgical history.  OB History   No obstetric history on file.      Home Medications    Prior to Admission medications   Medication Sig Start Date End Date Taking? Authorizing Provider  cetirizine (ZYRTEC) 10 MG tablet Take 1 tablet (10 mg total) by mouth daily. 09/20/21  Yes Denica Web, Rolly Salter E, FNP  dextromethorphan-guaiFENesin (MUCINEX DM) 30-600 MG 12hr tablet Take 1 tablet by mouth 2 (two) times daily. 09/20/21  Yes Leonard Hendler, Rolly Salter E, FNP  fluticasone (FLONASE) 50 MCG/ACT nasal spray Place 1 spray into both nostrils daily. 09/20/21  Yes Taira Knabe, Rolly Salter E, FNP  doxycycline (VIBRAMYCIN) 100 MG capsule Take 1 capsule (100 mg total) by mouth 2 (two) times daily. 05/05/20   Avegno, Zachery Dakins, FNP  lidocaine (XYLOCAINE) 2 % solution 5-15 mL gurgle as needed 12/14/19   Cathie Hoops, Amy V, PA-C  ondansetron (ZOFRAN ODT) 4 MG disintegrating tablet Take 1 tablet (4 mg total) by mouth every 8 (eight) hours as needed for nausea or vomiting. 12/14/19   Belinda Fisher, PA-C    Family History Family History  Problem Relation Age of Onset   Kidney disease Mother    Diabetes Father     Social History Social History   Tobacco Use   Smoking status: Never   Smokeless tobacco: Never  Substance Use Topics   Alcohol use: No     Alcohol/week: 0.0 standard drinks   Drug use: No     Allergies   Penicillins   Review of Systems Review of Systems Per HPI  Physical Exam Triage Vital Signs ED Triage Vitals  Enc Vitals Group     BP 09/20/21 1103 127/81     Pulse Rate 09/20/21 1103 91     Resp 09/20/21 1103 16     Temp 09/20/21 1103 98.4 F (36.9 C)     Temp Source 09/20/21 1103 Oral     SpO2 09/20/21 1103 98 %     Weight --      Height --      Head Circumference --      Peak Flow --      Pain Score 09/20/21 1109 0     Pain Loc --      Pain Edu? --      Excl. in GC? --    No data found.  Updated Vital Signs BP 127/81 (BP Location: Left Arm)   Pulse 91   Temp 98.4 F (36.9 C) (Oral)   Resp 16   SpO2 98%   Visual Acuity Right Eye Distance:   Left Eye Distance:   Bilateral Distance:    Right Eye Near:   Left Eye Near:    Bilateral Near:  Physical Exam Constitutional:      General: She is not in acute distress.    Appearance: Normal appearance. She is not toxic-appearing or diaphoretic.  HENT:     Head: Normocephalic and atraumatic.     Right Ear: Tympanic membrane and ear canal normal.     Left Ear: Tympanic membrane and ear canal normal.     Nose: Congestion present.     Mouth/Throat:     Mouth: Mucous membranes are moist.     Pharynx: Posterior oropharyngeal erythema present.  Eyes:     Extraocular Movements: Extraocular movements intact.     Conjunctiva/sclera: Conjunctivae normal.     Pupils: Pupils are equal, round, and reactive to light.  Cardiovascular:     Rate and Rhythm: Normal rate and regular rhythm.     Pulses: Normal pulses.     Heart sounds: Normal heart sounds.  Pulmonary:     Effort: Pulmonary effort is normal. No respiratory distress.     Breath sounds: Normal breath sounds. No stridor. No wheezing or rhonchi.  Abdominal:     General: Abdomen is flat. Bowel sounds are normal.     Palpations: Abdomen is soft.  Musculoskeletal:        General: Normal range  of motion.     Cervical back: Normal range of motion.  Skin:    General: Skin is warm and dry.  Neurological:     General: No focal deficit present.     Mental Status: She is alert and oriented to person, place, and time. Mental status is at baseline.  Psychiatric:        Mood and Affect: Mood normal.        Behavior: Behavior normal.     UC Treatments / Results  Labs (all labs ordered are listed, but only abnormal results are displayed) Labs Reviewed  CULTURE, GROUP A STREP ([facility])  NOVEL CORONAVIRUS, NAA  POCT INFLUENZA A/B  POCT RAPID STREP A (OFFICE)    EKG   Radiology No results found.  Procedures Procedures (including critical care time)  Medications Ordered in UC Medications - No data to display  Initial Impression / Assessment and Plan / UC Course  I have reviewed the triage vital signs and the nursing notes.  Pertinent labs & imaging results that were available during my care of the patient were reviewed by me and considered in my medical decision making (see chart for details).     Patient presents with symptoms likely from a viral upper respiratory infection. Differential includes bacterial pneumonia, sinusitis, allergic rhinitis, Covid 19. Do not suspect underlying cardiopulmonary process. Symptoms seem unlikely related to ACS, CHF or COPD exacerbations, pneumonia, pneumothorax. Patient is nontoxic appearing and not in need of emergent medical intervention.  Recommended symptom control with over the counter medications: Daily oral anti-histamine, Oral decongestant or IN corticosteroid, saline irrigations, cepacol lozenges, Robitussin, Delsym, honey tea.  Return if symptoms fail to improve in 1-2 weeks or you develop shortness of breath, chest pain, severe headache. Patient states understanding and is agreeable.  Discharged with PCP followup.  Final Clinical Impressions(s) / UC Diagnoses   Final diagnoses:  Viral upper respiratory tract infection with  cough  Sore throat  Encounter for laboratory testing for COVID-19 virus     Discharge Instructions      It appears that you have a viral upper respiratory infection that should resolve in the next few days with symptomatic treatment.  You have been prescribed 3 medications to help  alleviate your symptoms.  Rapid strep test and rapid flu test was negative.  Throat culture and COVID-19 viral swabs are pending.  We will call if they are positive.     ED Prescriptions     Medication Sig Dispense Auth. Provider   cetirizine (ZYRTEC) 10 MG tablet Take 1 tablet (10 mg total) by mouth daily. 30 tablet [place], [place] E, [place]   fluticasone [facility]) 50 MCG/ACT nasal spray Place 1 spray into both nostrils daily. 16 g Aidenn Skellenger, Rolly Salter E, [place]   dextromethorphan-guaiFENesin [facility] DM) 30-600 MG 12hr tablet Take 1 tablet by mouth 2 (two) times daily. 30 tablet [place], Acie Fredrickson, [place]      PDMP not reviewed this encounter.   Gustavus Bryant, [place] 09/20/21 1208

## 2021-09-20 NOTE — ED Triage Notes (Signed)
Pt c/o chills, fever (101F at home), cough, headache, sore throat, body aches onset yesterday. Denies nausea, vomiting, diarrhea constipation. States tried tylenol at home without relief.

## 2021-09-20 NOTE — Discharge Instructions (Signed)
It appears that you have a viral upper respiratory infection that should resolve in the next few days with symptomatic treatment.  You have been prescribed 3 medications to help alleviate your symptoms.  Rapid strep test and rapid flu test was negative.  Throat culture and COVID-19 viral swabs are pending.  We will call if they are positive.

## 2021-09-21 LAB — NOVEL CORONAVIRUS, NAA: SARS-CoV-2, NAA: NOT DETECTED

## 2021-09-21 LAB — SARS-COV-2, NAA 2 DAY TAT

## 2021-09-23 LAB — CULTURE, GROUP A STREP (THRC)

## 2021-09-26 ENCOUNTER — Telehealth (HOSPITAL_COMMUNITY): Payer: Self-pay | Admitting: Emergency Medicine

## 2021-09-26 MED ORDER — AZITHROMYCIN 250 MG PO TABS: 250.0000 mg | ORAL_TABLET | Freq: Every day | ORAL | 0 refills | Status: DC

## 2021-12-31 ENCOUNTER — Telehealth: Payer: 59 | Admitting: Family Medicine

## 2021-12-31 DIAGNOSIS — U071 COVID-19: Secondary | ICD-10-CM

## 2021-12-31 MED ORDER — PROMETHAZINE-DM 6.25-15 MG/5ML PO SYRP: 2.5000 mL | ORAL_SOLUTION | Freq: Three times a day (TID) | ORAL | 0 refills | Status: DC | PRN

## 2021-12-31 MED ORDER — BENZONATATE 100 MG PO CAPS: 100.0000 mg | ORAL_CAPSULE | Freq: Three times a day (TID) | ORAL | 0 refills | Status: AC | PRN

## 2021-12-31 MED ORDER — FLUTICASONE PROPIONATE 50 MCG/ACT NA SUSP: 2.0000 | Freq: Every day | NASAL | 0 refills | Status: AC

## 2021-12-31 NOTE — Progress Notes (Signed)
Virtual Visit Consent   Jody Singleton, you are scheduled for a virtual visit with a [facility] provider today.     Just as with appointments in the office, your consent must be obtained to participate.  Your consent will be active for this visit and any virtual visit you may have with one of our providers in the next 365 days.     If you have a MyChart account, a copy of this consent can be sent to you electronically.  All virtual visits are billed to your insurance company just like a traditional visit in the office.    As this is a virtual visit, video technology does not allow for your provider to perform a traditional examination.  This may limit your provider's ability to fully assess your condition.  If your provider identifies any concerns that need to be evaluated in person or the need to arrange testing (such as labs, EKG, etc.), we will make arrangements to do so.     Although advances in technology are sophisticated, we cannot ensure that it will always work on either your end or our end.  If the connection with a video visit is poor, the visit may have to be switched to a telephone visit.  With either a video or telephone visit, we are not always able to ensure that we have a secure connection.     I need to obtain your verbal consent now.   Are you willing to proceed with your visit today?    Jody Singleton has provided verbal consent on 12/31/2021 for a virtual visit (video or telephone).   Jody Mayo, NP   Date: 12/31/2021 12:14 PM   Virtual Visit via Video Note   I, Jody Singleton, connected with  Jody Singleton  ([id number], [birth year]) on 12/31/21 at 12:15 PM EST by a video-enabled telemedicine application and verified that I am speaking with the correct person using two identifiers.  Location: Patient: Virtual Visit Location Patient: Home Provider: Virtual Visit Location Provider: Home Office   I discussed the limitations of evaluation and management by  telemedicine and the availability of in person appointments. The patient expressed understanding and agreed to proceed.    History of Present Illness: Jody Singleton is a 26 y.o. who identifies as a female who was assigned female at birth, and is being seen today for tested positive yesterday on a home test for coivd 19. Symptoms started Saturday started feeling tired, developed a fever on Sunday T max of 101.3. Reports having sore throat, cough, nasal congestion, headaches and body aches.  Denies chest pain, SHOB, ear pain.   Works in [employer], independent living community. There was an outbreak.   COVID vaccinated - no booster yet Flu vaccine this year as well.   Problems:  Patient Active Problem List   Diagnosis Date Noted   Hematuria 04/14/2017   Pre-diabetes 09/03/2015    Allergies:  Allergies  Allergen Reactions   Penicillins Hives        Medications:  Current Outpatient Medications:    azithromycin (ZITHROMAX) 250 MG tablet, Take 1 tablet (250 mg total) by mouth daily. Take first 2 tablets together, then 1 every day until finished., Disp: 6 tablet, Rfl: 0   cetirizine (ZYRTEC) 10 MG tablet, Take 1 tablet (10 mg total) by mouth daily., Disp: 30 tablet, Rfl: 0   dextromethorphan-guaiFENesin (MUCINEX DM) 30-600 MG 12hr tablet, Take 1 tablet by mouth 2 (two) times daily., Disp:  30 tablet, Rfl: 0   doxycycline (VIBRAMYCIN) 100 MG capsule, Take 1 capsule (100 mg total) by mouth 2 (two) times daily., Disp: 20 capsule, Rfl: 0   fluticasone (FLONASE) 50 MCG/ACT nasal spray, Place 1 spray into both nostrils daily., Disp: 16 g, Rfl: 0   lidocaine (XYLOCAINE) 2 % solution, 5-15 mL gurgle as needed, Disp: 150 mL, Rfl: 0   ondansetron (ZOFRAN ODT) 4 MG disintegrating tablet, Take 1 tablet (4 mg total) by mouth every 8 (eight) hours as needed for nausea or vomiting., Disp: 10 tablet, Rfl: 0  Observations/Objective: Patient is well-developed, well-nourished in no acute distress.   Resting comfortably  at home.  Head is normocephalic, atraumatic.  No labored breathing.  Speech is clear and coherent with logical content.  Patient is alert and oriented at baseline.  Nasal congestion tone noted Cough present  Assessment and Plan: 1. COVID-19  HT + for covid S&S are mild will avoid antiviral at this time will treat with OTC measures as well as scripts below. Info on covid on AVS   Reviewed side effects, risks and benefits of medication.    Patient acknowledged agreement and understanding of the plan.     - fluticasone (FLONASE) 50 MCG/ACT nasal spray; Place 2 sprays into both nostrils daily.  Dispense: 16 g; Refill: 0 - promethazine-dextromethorphan (PROMETHAZINE-DM) 6.25-15 MG/5ML syrup; Take 2.5 mLs by mouth 3 (three) times daily as needed for cough.  Dispense: 118 mL; Refill: 0 - benzonatate (TESSALON) 100 MG capsule; Take 1 capsule (100 mg total) by mouth 3 (three) times daily as needed for cough.  Dispense: 20 capsule; Refill: 0   Follow Up Instructions: I discussed the assessment and treatment plan with the patient. The patient was provided an opportunity to ask questions and all were answered. The patient agreed with the plan and demonstrated an understanding of the instructions.  A copy of instructions were sent to the patient via MyChart unless otherwise noted below.   The patient was advised to call back or seek an in-person evaluation if the symptoms worsen or if the condition fails to improve as anticipated.  Time:  I spent 10 minutes with the patient via telehealth technology discussing the above problems/concerns.    Jody Mayo, NP

## 2021-12-31 NOTE — Patient Instructions (Signed)
I hope you feel better soon!  Please keep well-hydrated and get plenty of rest. Start a saline nasal rinse to flush out your nasal passages. You can use plain Mucinex to help thin congestion. If you have a humidifier, running in the bedroom at night. I want you to start OTC vitamin D3 1000 units daily, vitamin C 1000 mg daily, and a zinc supplement. Please take prescribed medications as directed.  You have been enrolled in a MyChart symptom monitoring program. Please answer these questions daily so we can keep track of how you are doing.  You were to quarantine for 5 days from onset of your symptoms.  After day 5, if you have had no fever and you are feeling better, you can end quarantine but need to mask for an additional 5 days. After day 5 if you have a fever or are having significant symptoms, please quarantine for full 10 days.  If you note any worsening of symptoms, any significant shortness of breath or any chest pain, please seek ER evaluation ASAP.  Please do not delay care!  COVID-19: What to Do if You Are Sick If you test positive and are an older adult or someone who is at high risk of getting very sick from COVID-19, treatment may be available. Contact a healthcare provider right away after a positive test to determine if you are eligible, even if your symptoms are mild right now. You can also visit a Test to Treat location and, if eligible, receive a prescription from a provider. Don't delay: Treatment must be started within the first few days to be effective. If you have a fever, cough, or other symptoms, you might have COVID-19. Most people have mild illness and are able to recover at home. If you are sick: Keep track of your symptoms. If you have an emergency warning sign (including trouble breathing), call 911. Steps to help prevent the spread of COVID-19 if you are sick If you are sick with COVID-19 or think you might have COVID-19, follow the steps below to care for  yourself and to help protect other people in your home and community. Stay home except to get medical care Stay home. Most people with COVID-19 have mild illness and can recover at home without medical care. Do not leave your home, except to get medical care. Do not visit public areas and do not go to places where you are unable to wear a mask. Take care of yourself. Get rest and stay hydrated. Take over-the-counter medicines, such as acetaminophen, to help you feel better. Stay in touch with your doctor. Call before you get medical care. Be sure to get care if you have trouble breathing, or have any other emergency warning signs, or if you think it is an emergency. Avoid public transportation, ride-sharing, or taxis if possible. Get tested If you have symptoms of COVID-19, get tested. While waiting for test results, stay away from others, including staying apart from those living in your household. Get tested as soon as possible after your symptoms start. Treatments may be available for people with COVID-19 who are at risk for becoming very sick. Don't delay: Treatment must be started early to be effective--some treatments must begin within 5 days of your first symptoms. Contact your healthcare provider right away if your test result is positive to determine if you are eligible. Self-tests are one of several options for testing for the virus that causes COVID-19 and may be more convenient than laboratory-based tests and  point-of-care tests. Ask your healthcare provider or your local health department if you need help interpreting your test results. You can visit your state, tribal, local, and territorial health department's website to look for the latest local information on testing sites. Separate yourself from other people As much as possible, stay in a specific room and away from other people and pets in your home. If possible, you should use a separate bathroom. If you need to be around other people  or animals in or outside of the home, wear a well-fitting mask. Tell your close contacts that they may have been exposed to COVID-19. An infected person can spread COVID-19 starting 48 hours (or 2 days) before the person has any symptoms or tests positive. By letting your close contacts know they may have been exposed to COVID-19, you are helping to protect everyone. See COVID-19 and Animals if you have questions about pets. If you are diagnosed with COVID-19, someone from the health department may call you. Answer the call to slow the spread. Monitor your symptoms Symptoms of COVID-19 include fever, cough, or other symptoms. Follow care instructions from your healthcare provider and local health department. Your local health authorities may give instructions on checking your symptoms and reporting information. When to seek emergency medical attention Look for emergency warning signs* for COVID-19. If someone is showing any of these signs, seek emergency medical care immediately: Trouble breathing Persistent pain or pressure in the chest New confusion Inability to wake or stay awake Pale, gray, or blue-colored skin, lips, or nail beds, depending on skin tone *This list is not all possible symptoms. Please call your medical provider for any other symptoms that are severe or concerning to you. Call 911 or call ahead to your local emergency facility: Notify the operator that you are seeking care for someone who has or may have COVID-19. Call ahead before visiting your doctor Call ahead. Many medical visits for routine care are being postponed or done by phone or telemedicine. If you have a medical appointment that cannot be postponed, call your doctor's office, and tell them you have or may have COVID-19. This will help the office protect themselves and other patients. If you are sick, wear a well-fitting mask You should wear a mask if you must be around other people or animals, including pets (even  at home). Wear a mask with the best fit, protection, and comfort for you. You don't need to wear the mask if you are alone. If you can't put on a mask (because of trouble breathing, for example), cover your coughs and sneezes in some other way. Try to stay at least 6 feet away from other people. This will help protect the people around you. Masks should not be placed on young children under age 22 years, anyone who has trouble breathing, or anyone who is not able to remove the mask without help. Cover your coughs and sneezes Cover your mouth and nose with a tissue when you cough or sneeze. Throw away used tissues in a lined trash can. Immediately wash your hands with soap and water for at least 20 seconds. If soap and water are not available, clean your hands with an alcohol-based hand sanitizer that contains at least 60% alcohol. Clean your hands often Wash your hands often with soap and water for at least 20 seconds. This is especially important after blowing your nose, coughing, or sneezing; going to the bathroom; and before eating or preparing food. Use hand sanitizer if soap  and water are not available. Use an alcohol-based hand sanitizer with at least 60% alcohol, covering all surfaces of your hands and rubbing them together until they feel dry. Soap and water are the best option, especially if hands are visibly dirty. Avoid touching your eyes, nose, and mouth with unwashed hands. Handwashing Tips Avoid sharing personal household items Do not share dishes, drinking glasses, cups, eating utensils, towels, or bedding with other people in your home. Wash these items thoroughly after using them with soap and water or put in the dishwasher. Clean surfaces in your home regularly Clean and disinfect high-touch surfaces (for example, doorknobs, tables, handles, light switches, and countertops) in your "sick room" and bathroom. In shared spaces, you should clean and disinfect surfaces and items after  each use by the person who is ill. If you are sick and cannot clean, a caregiver or other person should only clean and disinfect the area around you (such as your bedroom and bathroom) on an as needed basis. Your caregiver/other person should wait as long as possible (at least several hours) and wear a mask before entering, cleaning, and disinfecting shared spaces that you use. Clean and disinfect areas that may have blood, stool, or body fluids on them. Use household cleaners and disinfectants. Clean visible dirty surfaces with household cleaners containing soap or detergent. Then, use a household disinfectant. Use a product from [employer] List N: Disinfectants for Coronavirus (COVID-19). Be sure to follow the instructions on the label to ensure safe and effective use of the product. Many products recommend keeping the surface wet with a disinfectant for a certain period of time (look at "contact time" on the product label). You may also need to wear personal protective equipment, such as gloves, depending on the directions on the product label. Immediately after disinfecting, wash your hands with soap and water for 20 seconds. For completed guidance on cleaning and disinfecting your home, visit Complete Disinfection Guidance. Take steps to improve ventilation at home Improve ventilation (air flow) at home to help prevent from spreading COVID-19 to other people in your household. Clear out COVID-19 virus particles in the air by opening windows, using air filters, and turning on fans in your home. Use this interactive tool to learn how to improve air flow in your home. When you can be around others after being sick with COVID-19 Deciding when you can be around others is different for different situations. Find out when you can safely end home isolation. For any additional questions about your care, contact your healthcare provider or state or local health department. 02/19/2021 Content source: [facility] for Immunization and Respiratory Diseases ([facility]), Division of Viral Diseases This information is not intended to replace advice given to you by your health care provider. Make sure you discuss any questions you have with your health care provider. Document Revised: 04/04/2021 Document Reviewed: 04/04/2021 Elsevier Patient Education  2022 [employer].

## 2022-10-05 ENCOUNTER — Ambulatory Visit
Admission: RE | Admit: 2022-10-05 | Discharge: 2022-10-05 | Disposition: A | Discharge status: Home / Self Care | Payer: 59 | Source: Ambulatory Visit | Attending: Internal Medicine | Admitting: Internal Medicine

## 2022-10-05 VITALS — BP 130/83 | HR 87 | Temp 98.0°F | Resp 18

## 2022-10-05 DIAGNOSIS — J029 Acute pharyngitis, unspecified: Secondary | ICD-10-CM | POA: Diagnosis present

## 2022-10-05 LAB — POCT RAPID STREP A (OFFICE): Rapid Strep A Screen: NEGATIVE

## 2022-10-05 LAB — POCT MONO SCREEN (KUC): Mono, POC: NEGATIVE

## 2022-10-05 MED ORDER — CEFDINIR 300 MG PO CAPS: 300.0000 mg | ORAL_CAPSULE | Freq: Two times a day (BID) | ORAL | 0 refills | Status: AC

## 2022-10-05 NOTE — Discharge Instructions (Signed)
Your strep test and monotest were negative.  I am treating you with an antibiotic due to concerns of bacterial infection of your throat.  Please follow-up any symptoms persist or worsen.

## 2022-10-05 NOTE — ED Triage Notes (Signed)
Pt is present today with sore throat and SOB. Pt sx started one week ago

## 2022-10-05 NOTE — ED Provider Notes (Signed)
[facility]    CSN: [id number] Arrival date & time: 10/05/22  1044      History   Chief Complaint Chief Complaint  Patient presents with   Sore Throat    Entered by patient    HPI Jody Singleton is a 26 y.o. female.   Patient presents with sore throat that has been present for about a week.  She denies any associated upper respiratory symptoms, nasal congestion, runny nose, cough, fever.  Denies any known sick contacts.  Patient also endorses shortness of breath that occurs only when she lays flat at night.  She states that she has had this associated shortness of breath and sore throat in the past due to acute tonsillitis.  Was treated for acute tonsillitis, and the symptoms and shortness of breath resolved.  Denies any associated chest pain, ear pain, nausea, vomiting, diarrhea, abdominal pain.  Patient does not report taking any medications to alleviate symptoms.   Sore Throat    Past Medical History:  Diagnosis Date   Anxiety     Patient Active Problem List   Diagnosis Date Noted   Hematuria 04/14/2017   Pre-diabetes 09/03/2015    History reviewed. No pertinent surgical history.  OB History   No obstetric history on file.      Home Medications    Prior to Admission medications   Medication Sig Start Date End Date Taking? Authorizing Provider  cefdinir (OMNICEF) 300 MG capsule Take 1 capsule (300 mg total) by mouth 2 (two) times daily for 10 days. 10/05/22 10/15/22 Yes Calyssa Zobrist, Acie Fredrickson, FNP  benzonatate (TESSALON) 100 MG capsule Take 1 capsule (100 mg total) by mouth 3 (three) times daily as needed for cough. 12/31/21   Freddy Finner, NP  cetirizine (ZYRTEC) 10 MG tablet Take 1 tablet (10 mg total) by mouth daily. 09/20/21   Gustavus Bryant, FNP  dextromethorphan-guaiFENesin (MUCINEX DM) 30-600 MG 12hr tablet Take 1 tablet by mouth 2 (two) times daily. 09/20/21   Gustavus Bryant, FNP  fluticasone (FLONASE) 50 MCG/ACT nasal spray Place 2 sprays into  both nostrils daily. 12/31/21   Freddy Finner, NP  lidocaine (XYLOCAINE) 2 % solution 5-15 mL gurgle as needed 12/14/19   Cathie Hoops, Amy V, PA-C  ondansetron (ZOFRAN ODT) 4 MG disintegrating tablet Take 1 tablet (4 mg total) by mouth every 8 (eight) hours as needed for nausea or vomiting. 12/14/19   Cathie Hoops, Amy V, PA-C  promethazine-dextromethorphan (PROMETHAZINE-DM) 6.25-15 MG/5ML syrup Take 2.5 mLs by mouth 3 (three) times daily as needed for cough. 12/31/21   Freddy Finner, NP    Family History Family History  Problem Relation Age of Onset   Kidney disease Mother    Diabetes Father     Social History Social History   Tobacco Use   Smoking status: Never   Smokeless tobacco: Never  Substance Use Topics   Alcohol use: No    Alcohol/week: 0.0 standard drinks of alcohol   Drug use: No     Allergies   Penicillins   Review of Systems Review of Systems Per HPI  Physical Exam Triage Vital Signs ED Triage Vitals [10/05/22 1109]  Enc Vitals Group     BP 130/83     Pulse Rate 87     Resp 18     Temp 98 F (36.7 C)     Temp src      SpO2 98 %     Weight  Height      Head Circumference      Peak Flow      Pain Score 7     Pain Loc      Pain Edu?      Excl. in [place]?    No data found.  Updated Vital Signs BP 130/83   Pulse 87   Temp 98 F (36.7 C)   Resp 18   SpO2 98%   Visual Acuity Right Eye Distance:   Left Eye Distance:   Bilateral Distance:    Right Eye Near:   Left Eye Near:    Bilateral Near:     Physical Exam Constitutional:      General: She is not in acute distress.    Appearance: Normal appearance. She is not toxic-appearing or diaphoretic.  HENT:     Head: Normocephalic and atraumatic.     Right Ear: Tympanic membrane and ear canal normal.     Left Ear: Tympanic membrane and ear canal normal.     Nose: No congestion.     Mouth/Throat:     Mouth: Mucous membranes are moist.     Pharynx: Posterior oropharyngeal erythema present.     Comments:  It is difficult to evaluate posterior pharynx and tonsils given obstruction of patient's tongue. No tongue swelling just due to anatomy. Attempted to lower with tongue depressor but still had difficulty visualizing.  It appears that patient's tonsils are enlarged but I do not see any exudate.  There does not appear to be any signs of peritonsillar abscess. Eyes:     Extraocular Movements: Extraocular movements intact.     Conjunctiva/sclera: Conjunctivae normal.     Pupils: Pupils are equal, round, and reactive to light.  Cardiovascular:     Rate and Rhythm: Normal rate and regular rhythm.     Pulses: Normal pulses.     Heart sounds: Normal heart sounds.  Pulmonary:     Effort: Pulmonary effort is normal. No respiratory distress.     Breath sounds: Normal breath sounds. No stridor. No wheezing, rhonchi or rales.  Abdominal:     General: Abdomen is flat. Bowel sounds are normal.     Palpations: Abdomen is soft.  Musculoskeletal:        General: Normal range of motion.     Cervical back: Normal range of motion.  Skin:    General: Skin is warm and dry.  Neurological:     General: No focal deficit present.     Mental Status: She is alert and oriented to person, place, and time. Mental status is at baseline.  Psychiatric:        Mood and Affect: Mood normal.        Behavior: Behavior normal.      UC Treatments / Results  Labs (all labs ordered are listed, but only abnormal results are displayed) Labs Reviewed  CULTURE, GROUP A STREP [facility])  POCT RAPID STREP A (OFFICE)  POCT MONO SCREEN ([facility])    EKG   Radiology No results found.  Procedures Procedures (including critical care time)  Medications Ordered in UC Medications - No data to display  Initial Impression / Assessment and Plan / UC Course  I have reviewed the triage vital signs and the nursing notes.  Pertinent labs & imaging results that were available during my care of the patient were reviewed by me and  considered in my medical decision making (see chart for details).     Physical exam is consistent  with acute tonsillitis.  Rapid strep and rapid mono were negative.  Will opt to treat with cefdinir antibiotic to alleviate symptoms for bacterial infection and acute tonsillitis.  Patient has taken cephalosporins before and tolerated well despite penicillin allergy.  No signs of peritonsillar abscess on exam.  No adventitious lung sounds on exam or signs of tachypnea so do not think that chest imaging is necessary.  Do not think that emergent evaluation is necessary either.  She was advised to follow-up if symptoms persist or worsen.  Patient verbalized understanding and was agreeable with plan. Final Clinical Impressions(s) / UC Diagnoses   Final diagnoses:  Acute pharyngitis, unspecified etiology     Discharge Instructions      Your strep test and monotest were negative.  I am treating you with an antibiotic due to concerns of bacterial infection of your throat.  Please follow-up any symptoms persist or worsen.    ED Prescriptions     Medication Sig Dispense Auth. Provider   cefdinir (OMNICEF) 300 MG capsule Take 1 capsule (300 mg total) by mouth 2 (two) times daily for 10 days. 20 capsule Gustavus Bryant, [place]      PDMP not reviewed this encounter.   Gustavus Bryant, [place] 10/05/22 1215

## 2022-10-08 LAB — CULTURE, GROUP A STREP (THRC)

## 2023-03-18 ENCOUNTER — Encounter (HOSPITAL_COMMUNITY): Payer: Self-pay | Admitting: Emergency Medicine

## 2023-03-18 ENCOUNTER — Ambulatory Visit (HOSPITAL_COMMUNITY)
Admission: EM | Admit: 2023-03-18 | Discharge: 2023-03-18 | Disposition: A | Discharge status: Home / Self Care | Payer: 59

## 2023-03-18 DIAGNOSIS — S63641A Sprain of metacarpophalangeal joint of right thumb, initial encounter: Secondary | ICD-10-CM | POA: Diagnosis not present

## 2023-03-18 HISTORY — DX: Attention-deficit hyperactivity disorder, unspecified type: F90.9

## 2023-03-18 MED ORDER — NAPROXEN 500 MG PO TABS: 500.0000 mg | ORAL_TABLET | Freq: Two times a day (BID) | ORAL | 0 refills | Status: AC

## 2023-03-18 NOTE — ED Triage Notes (Signed)
Pt c/o right thumb pain for 4-5 days. Denies injury,. Reports ice and tylenol that helps temporarily.

## 2023-03-18 NOTE — Discharge Instructions (Addendum)
Rest,wear splint daily for support/cmfort-follow up with orthopedics(call for appt). May follow up with [facility] or orthopedics of your choice. Take naproxe as directed for pain(do not take other NSAIDS while taken naproxen(Aleve,ibuprofen,etc),may take tylenol as label directed for pain.

## 2023-03-18 NOTE — ED Provider Notes (Signed)
[facility]    CSN: [id number] Arrival date & time: 03/18/23  1506      History   Chief Complaint Chief Complaint  Patient presents with   Hand Problem    Entered by patient    HPI Jody Singleton is a 27 y.o. female.   27 year old female, Jody Singleton, presents to urgent care with chief complaint of right thumb problem for approximately 5 days patient denies any injury.  Pt states thumb "locks up", patient is right-hand dominant and works as a Nurse, learning disability.  She has a past medical history of carpal tunnel in the past but thinks it was nondominant hand in past,but not sure.   The history is provided by the patient. No language interpreter was used.    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      Home Medications    Prior to Admission medications   Medication Sig Start Date End Date Taking? Authorizing Provider  medroxyPROGESTERone (DEPO-PROVERA) 150 MG/ML injection Inject 150 mg into the muscle every 3 (three) months. 01/09/23  Yes [provider]  naproxen (NAPROSYN) 500 MG tablet Take 1 tablet (500 mg total) by mouth 2 (two) times daily for 5 days. 03/18/23 03/23/23 Yes Zedrick Springsteen, Para March, NP  amphetamine-dextroamphetamine (ADDERALL) 5 MG tablet Take 5 mg by mouth 2 (two) times daily with a meal.    [provider]  benzonatate (TESSALON) 100 MG capsule Take 1 capsule (100 mg total) by mouth 3 (three) times daily as needed for cough. 12/31/21   Freddy Finner, NP  cetirizine (ZYRTEC) 10 MG tablet Take 1 tablet (10 mg total) by mouth daily. 09/20/21   Gustavus Bryant, FNP  dextromethorphan-guaiFENesin (MUCINEX DM) 30-600 MG 12hr  tablet Take 1 tablet by mouth 2 (two) times daily. 09/20/21   Gustavus Bryant, FNP  fluticasone (FLONASE) 50 MCG/ACT nasal spray Place 2 sprays into both nostrils daily. 12/31/21   Freddy Finner, NP  lidocaine (XYLOCAINE) 2 % solution 5-15 mL gurgle as needed 12/14/19   Cathie Hoops, Amy V, PA-C  ondansetron (ZOFRAN ODT) 4 MG disintegrating tablet Take 1 tablet (4 mg total) by mouth every 8 (eight) hours as needed for nausea or vomiting. 12/14/19   Cathie Hoops, Amy V, PA-C  promethazine-dextromethorphan (PROMETHAZINE-DM) 6.25-15 MG/5ML syrup Take 2.5 mLs by mouth 3 (three) times daily as needed for cough. 12/31/21   Freddy Finner, NP    Family History Family History  Problem Relation Age of Onset   Kidney disease Mother    Diabetes Father     Social History Social History   Tobacco Use   Smoking status: Never   Smokeless tobacco: Never  Substance Use Topics   Alcohol use: No    Alcohol/week: 0.0 standard drinks of alcohol   Drug use: No     Allergies   Penicillins   Review of Systems Review of Systems  Musculoskeletal:  Positive for arthralgias and myalgias.  Skin:  Negative for color change, pallor, rash and wound.  Neurological:  Negative for dizziness and weakness.  All other systems reviewed and are negative.  Physical Exam Triage Vital Signs ED Triage Vitals  Enc Vitals Group     BP 03/18/23 1541 120/80     Pulse Rate 03/18/23 1541 76     Resp 03/18/23 1541 16     Temp 03/18/23 1541 97.9 F (36.6 C)     Temp Source 03/18/23 1541 Oral     SpO2 03/18/23 1541 98 %     Weight --      Height --      Head Circumference --      Peak Flow --      Pain Score 03/18/23 1538 6     Pain Loc --      Pain Edu? --      Excl. in [place]? --    No data found.  Updated Vital Signs BP 120/80 (BP Location: Right Arm)   Pulse 76   Temp 97.9 F (36.6 C) (Oral)   Resp 16   SpO2 98%   Visual Acuity Right Eye Distance:   Left Eye Distance:   Bilateral Distance:    Right Eye Near:    Left Eye Near:    Bilateral Near:     Physical Exam Vitals and nursing note reviewed.  Musculoskeletal:     Right hand: Tenderness present. No swelling, deformity or lacerations. Normal range of motion. Normal strength. Normal sensation. There is no disruption of two-point discrimination. Normal capillary refill. Normal pulse.     Comments: Pt reports right thumb "locks up" and hurts with movement.Equal bilateral hand grip strength. Radial +2  Neurological:     General: No focal deficit present.     Mental Status: She is alert and oriented to person, place, and time.     GCS: GCS eye subscore is 4. GCS verbal subscore is 5. GCS motor subscore is 6.  Psychiatric:        Attention and Perception: Attention normal.        Mood and Affect: Mood normal.        Speech: Speech normal.      UC Treatments / Results  Labs (all labs ordered are listed, but only abnormal results are displayed) Labs Reviewed - No data to display  EKG   Radiology No results found.  Procedures Procedures (including critical care time)  Medications Ordered in UC Medications - No data to display  Initial Impression / Assessment and Plan / UC Course  I have reviewed the triage vital signs and the nursing notes.  Pertinent labs & imaging results that were available during my care of the patient were reviewed by me and considered in my medical decision making (see chart for details).    Pt placed in velcro splint, referred to Orthopedics, pt verbalized understanding to this provider. Ddx: Gamekeeper thumb(right), carpal tunnel, tendonitis Final Clinical Impressions(s) / UC Diagnoses   Final diagnoses:  Gamekeeper's thumb of right hand, initial encounter     Discharge Instructions      Rest,wear splint daily for support/cmfort-follow up with orthopedics(call for appt). May follow up with [facility] or orthopedics of your choice. Take naproxe as directed for pain(do not take other NSAIDS while taken  naproxen(Aleve,ibuprofen,etc),may take tylenol as label directed for pain.      ED Prescriptions     Medication Sig Dispense Auth. Provider   naproxen (NAPROSYN) 500 MG tablet Take 1 tablet (500 mg total) by mouth 2 (two) times daily for 5 days. 10 tablet Grayson White, Para March, NP      PDMP not reviewed this  encounter.   Clancy Gourd, NP 03/18/23 1744

## 2023-04-02 ENCOUNTER — Encounter: Payer: Self-pay | Admitting: Sports Medicine

## 2023-04-02 ENCOUNTER — Other Ambulatory Visit (INDEPENDENT_AMBULATORY_CARE_PROVIDER_SITE_OTHER): Payer: 59

## 2023-04-02 ENCOUNTER — Ambulatory Visit (INDEPENDENT_AMBULATORY_CARE_PROVIDER_SITE_OTHER): Payer: 59 | Admitting: Sports Medicine

## 2023-04-02 DIAGNOSIS — M79644 Pain in right finger(s): Secondary | ICD-10-CM

## 2023-04-02 DIAGNOSIS — M778 Other enthesopathies, not elsewhere classified: Secondary | ICD-10-CM

## 2023-04-02 MED ORDER — METHYLPREDNISOLONE 4 MG PO TBPK: ORAL_TABLET | ORAL | 0 refills | Status: AC

## 2023-04-02 NOTE — Progress Notes (Signed)
Jody Singleton - 27 y.o. female MRN [id number]  Date of birth: [birth year]  Office Visit Note: Visit Date: 04/02/2023 PCP: Virgilio Belling, PA-C Referred by: Virgilio Belling PA-C  Subjective: Chief Complaint  Patient presents with   Right Hand - Pain   HPI: Jody Singleton is a pleasant 27 y.o. female who presents today for right thumb pain x few weeks. She is RHD.  Right thumb pain for about 2 weeks.  No injury.  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  Pain with rotation.  She gets pain and weakness with gripping motions.  No numbness or tingling.  Only some relief from ibuprofen/Aleve, no improvement with Tylenol.  Pertinent ROS were reviewed with the patient and found to be negative unless otherwise specified above in HPI.   Assessment & Plan: Visit Diagnoses:  1. Thumb tendonitis   2. Pain of right thumb    Plan: Discussed with Jody Singleton that her signs and symptoms are most indicative of tendinitis from the wrist extending into the thumb.  Her x-rays are negative against any acute fracture or bony abnormality.  Will keep her in her thumb spica brace for support.  Will treat with a 6-day methylprednisolone taper and discussion on activity modification.  Did give recommendations for work for the next week to help settle this down, not provided.  May also ice for 15 minutes 2-3 times a day.  I would like to see her back in about 2 weeks for reevaluation.  Follow-up: Return in about 2 weeks (around 04/16/2023) for for right thumb.   Meds & Orders:  Meds ordered this encounter  Medications   methylPREDNISolone (MEDROL DOSEPAK) 4 MG TBPK tablet    Sig: Take per packet instructions. Taper dosing.    Dispense:  1 each    Refill:  0    Orders Placed This Encounter  Procedures   XR Hand Complete Right     Procedures: No procedures performed      Clinical History: No specialty comments available.  She  reports that she has never smoked. She has never used smokeless tobacco. No results for input(s): "HGBA1C", "LABURIC" in the last 8760 hours.  Objective:   Vital Signs: There were no vitals taken for this visit.  Physical Exam  Gen: Well-appearing, in no acute distress; non-toxic CV: Regular Rate. Well-perfused. Warm.  Resp: Breathing unlabored on room air; no wheezing. Psych: Fluid speech in conversation; appropriate affect; normal thought process Neuro: Sensation intact throughout. No gross coordination deficits.   Ortho Exam - Right wrist/thumb: There is some generalized pain over the dorsum of the thumb extending into the distal wrist.  Pain with resisted extension of the thumb, equivocal Finkelstein's testing.  Negative CMC grind test.  Full range of motion and strength.  No tenderness over the A1 pulley nodule.  No reproducible clicking or catching.  Okay sign intact.  Full strength of the wrist and thumb.  Imaging: XR Hand Complete Right  Result Date: 04/02/2023 To the right hand including AP, lateral and oblique films were ordered and reviewed by myself.  There is no evidence of bony fracture or acute bony abnormality.  No arthritic change.  There is notable negative ulnar variance.   Past Medical/Family/Surgical/Social History: Medications & Allergies reviewed per EMR, new medications updated. 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

## 2023-04-02 NOTE — Progress Notes (Signed)
Pain for a few weeks Right thumb Is wearing a thumb spica brace Pain with attempting to make a fist Has tried tylenol, ibuprofen, aleve with minimal to no relief  States that she feels like her hand is getting weak. Cannot do her job at times because of the weakness "Dropping" medications Cannot wear brace at work due to [employer] guidelines with PPE.

## 2023-04-16 ENCOUNTER — Encounter: Payer: Self-pay | Admitting: Sports Medicine

## 2023-04-16 ENCOUNTER — Ambulatory Visit (INDEPENDENT_AMBULATORY_CARE_PROVIDER_SITE_OTHER): Payer: 59 | Admitting: Sports Medicine

## 2023-04-16 DIAGNOSIS — M778 Other enthesopathies, not elsewhere classified: Secondary | ICD-10-CM

## 2023-04-16 DIAGNOSIS — M79644 Pain in right finger(s): Secondary | ICD-10-CM | POA: Diagnosis not present

## 2023-04-16 MED ORDER — MELOXICAM 15 MG PO TABS: 15.0000 mg | ORAL_TABLET | Freq: Every day | ORAL | 0 refills | Status: AC

## 2023-04-16 NOTE — Progress Notes (Signed)
Not doing good Still in pain, states a little worse Has a brace, but doesn't wear it Prednisone did not help much

## 2023-04-16 NOTE — Progress Notes (Signed)
Jody Singleton - 27 y.o. female MRN [id number]  Date of birth: 02-18-1996  Office Visit Note: Visit Date: 04/16/2023 PCP: Virgilio Belling, PA-C Referred by: Virgilio Belling PA-C  Subjective: Chief Complaint  Patient presents with   Right Hand - Pain   HPI: Jody Singleton is a pleasant 27 y.o. female who presents today for follow-up of right thumb pain.  She is still having some issues with the right thumb pain.  Feels like her pain is about the same as 2 weeks ago, she did complete a methylprednisolone taper pack.  Did not find much benefit.  Still is able to do daily activities in her job, will have some more soreness in the thumb the more she works.  Pain is localized over the dorsal aspect from the MCP into the wrist and at the distal radius.  Pain today is more so over the MCP joint itself.  Pertinent ROS were reviewed with the patient and found to be negative unless otherwise specified above in HPI.   Assessment & Plan: Visit Diagnoses:  1. Thumb tendonitis   2. Pain of right thumb    Plan: Discussed with Jody Singleton the nature of her wrist/thumb pain, this still seems quite indicative of tendinitis given the nature of her work.  She has had no injury, her x-rays and are benign.  It is slightly unusual that she did not receive hardly any benefit from the methylprednisolone taper, however.  We discussed next steps including diagnosis and treatment such as MRI of the wrist and thumb to quantify the exact pathology and see quality of the tendon/capsule, referral to occupational therapy, trial of oral NSAID, PT versus home therapy.  The pain is not stopping her from her activities of daily living and is not ready for MRI quite yet, which I think is reasonable.  We will start her on meloxicam 15 mg to be taken once daily.  She is not interested in formal occupational therapy, did provide her with a customized home rehab handout for first dorsal compartment and thumb extensor tendons.  She  is to start these once daily. Would expect this to settle down over the next month, if it does not she may message/call me for next steps.  Follow-up: Return if symptoms worsen or fail to improve, for 4 weeks if not improving - may call or message.   Meds & Orders:  Meds ordered this encounter  Medications   meloxicam (MOBIC) 15 MG tablet    Sig: Take 1 tablet (15 mg total) by mouth daily.    Dispense:  30 tablet    Refill:  0   No orders of the defined types were placed in this encounter.    Procedures: No procedures performed      Clinical History: No specialty comments available.  She reports that she has never smoked. She has never used smokeless tobacco. No results for input(s): "HGBA1C", "LABURIC" in the last 8760 hours.  Objective:   Vital Signs: There were no vitals taken for this visit.  Physical Exam  Gen: Well-appearing, in no acute distress; non-toxic CV: Well-perfused. Warm.  Resp: Breathing unlabored on room air; no wheezing. Psych: Fluid speech in conversation; appropriate affect; normal thought process Neuro: Sensation intact throughout. No gross coordination deficits.   Ortho Exam - Right thumb/wrist: No acute bony TTP about the palm or the rest.  Very generalized pain over the dorsum of the MCP joint and proximal end of the thumb.  There  is pain with resisted extension of the thumb, Finkelstein's is equivocal.  Okay sign intact.  There is no laxity of the UCL or or RCL.  No tenderness with palpation over the A1 pulley on the volar aspect.  There is no reproducible clicking or catching.  Imaging: No results found.  Past Medical/Family/Surgical/Social History: Medications & Allergies reviewed per EMR, new medications updated. 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

## 2023-05-15 ENCOUNTER — Ambulatory Visit
Admission: RE | Admit: 2023-05-15 | Discharge: 2023-05-15 | Disposition: A | Discharge status: Home / Self Care | Payer: 59 | Source: Ambulatory Visit | Attending: Nurse Practitioner | Admitting: Nurse Practitioner

## 2023-05-15 VITALS — BP 112/80 | HR 91 | Temp 98.4°F | Resp 16

## 2023-05-15 DIAGNOSIS — J029 Acute pharyngitis, unspecified: Secondary | ICD-10-CM | POA: Diagnosis present

## 2023-05-15 DIAGNOSIS — B349 Viral infection, unspecified: Secondary | ICD-10-CM | POA: Diagnosis present

## 2023-05-15 LAB — POCT RAPID STREP A (OFFICE): Rapid Strep A Screen: NEGATIVE

## 2023-05-15 MED ORDER — NAPROXEN 375 MG PO TABS
375.0000 mg | ORAL_TABLET | Freq: Two times a day (BID) | ORAL | 0 refills | Status: AC | PRN
Start: 2023-05-15 — End: ?

## 2023-05-15 MED ORDER — LIDOCAINE VISCOUS HCL 2 % MT SOLN: 15.0000 mL | Freq: Four times a day (QID) | OROMUCOSAL | 0 refills | Status: AC | PRN

## 2023-05-15 NOTE — ED Provider Notes (Addendum)
[facility]    CSN: [id number] Arrival date & time: 05/15/23  1715      History   Chief Complaint Chief Complaint  Patient presents with   Sore Throat    Entered by patient    HPI Jody Singleton is a 27 y.o. female  presents for evaluation of URI symptoms for 4 days. Patient reports associated symptoms of sore throat, congestion, headache, cough. Denies N/V/D, fevers or ear pain, body aches, shortness of breath. Patient does not have a hx of asthma or smoking. No known sick contacts.  Pt has taken Tylenol salt water gargles and lozenges OTC for symptoms. Pt has no other concerns at this time.    Sore Throat Associated symptoms include headaches.    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      Home Medications    Prior to Admission medications   Medication Sig Start Date End Date Taking? Authorizing Provider  lidocaine (XYLOCAINE) 2 % solution Use as directed 15 mLs in the mouth or throat every 6 (six) hours as needed (sore throat). Gargle and spit do not swallow 05/15/23  Yes Radford Pax, NP  naproxen (NAPROSYN) 375 MG tablet Take 1 tablet (375 mg total) by mouth 2 (two) times daily as needed (sore throat). 05/15/23  Yes Radford Pax, NP  amphetamine-dextroamphetamine (ADDERALL) 5 MG tablet Take 5 mg by mouth 2 (two) times daily with a meal.    [provider]  benzonatate (TESSALON) 100 MG capsule Take 1 capsule (100 mg total) by mouth 3 (three) times daily as needed for cough. 12/31/21   Freddy Finner, NP  cetirizine (ZYRTEC) 10 MG tablet Take 1 tablet (10 mg total) by mouth daily. 09/20/21   Gustavus Bryant, FNP   dextromethorphan-guaiFENesin (MUCINEX DM) 30-600 MG 12hr tablet Take 1 tablet by mouth 2 (two) times daily. 09/20/21   Gustavus Bryant, FNP  fluticasone (FLONASE) 50 MCG/ACT nasal spray Place 2 sprays into both nostrils daily. 12/31/21   Freddy Finner, NP  medroxyPROGESTERone (DEPO-PROVERA) 150 MG/ML injection Inject 150 mg into the muscle every 3 (three) months. 01/09/23   [provider]  meloxicam (MOBIC) 15 MG tablet Take 1 tablet (15 mg total) by mouth daily. 04/16/23   Madelyn Brunner, DO  methylPREDNISolone (MEDROL DOSEPAK) 4 MG TBPK tablet Take per packet instructions. Taper dosing. 04/02/23   Madelyn Brunner, DO  ondansetron (ZOFRAN ODT) 4 MG disintegrating tablet Take 1 tablet (4 mg total) by mouth every 8 (eight) hours as needed for nausea or vomiting. 12/14/19   Cathie Hoops, Amy V, PA-C  promethazine-dextromethorphan (PROMETHAZINE-DM) 6.25-15 MG/5ML syrup Take 2.5 mLs by mouth 3 (three) times daily as needed for cough. 12/31/21   Freddy Finner, NP    Family History Family History  Problem Relation Age of Onset   Kidney disease Mother    Diabetes Father     Social History Social History   Tobacco Use   Smoking status: Never   Smokeless tobacco: Never  Substance Use Topics   Alcohol use: No    Alcohol/week: 0.0 standard drinks of alcohol  Drug use: No     Allergies   Penicillins   Review of Systems Review of Systems  HENT:  Positive for congestion and sore throat.   Respiratory:  Positive for cough.   Neurological:  Positive for headaches.     Physical Exam Triage Vital Signs ED Triage Vitals  Enc Vitals Group     BP 05/15/23 1745 112/80     Pulse Rate 05/15/23 1745 91     Resp 05/15/23 1745 16     Temp 05/15/23 1745 98.4 F (36.9 C)     Temp Source 05/15/23 1745 Oral     SpO2 05/15/23 1745 98 %     Weight --      Height --      Head Circumference --      Peak Flow --      Pain Score 05/15/23 1744 4     Pain Loc --      Pain Edu? --      Excl. in GC? --     No data found.  Updated Vital Signs BP 112/80 (BP Location: Left Arm)   Pulse 91   Temp 98.4 F (36.9 C) (Oral)   Resp 16   SpO2 98%   Visual Acuity Right Eye Distance:   Left Eye Distance:   Bilateral Distance:    Right Eye Near:   Left Eye Near:    Bilateral Near:     Physical Exam Vitals and nursing note reviewed.  Constitutional:      General: She is not in acute distress.    Appearance: She is well-developed. She is not ill-appearing.  HENT:     Head: Normocephalic and atraumatic.     Right Ear: Tympanic membrane and ear canal normal.     Left Ear: Tympanic membrane and ear canal normal.     Nose: Congestion present.     Mouth/Throat:     Mouth: Mucous membranes are moist.     Pharynx: Oropharynx is clear. Uvula midline. Posterior oropharyngeal erythema present.     Tonsils: No tonsillar exudate or tonsillar abscesses.  Eyes:     Conjunctiva/sclera: Conjunctivae normal.     Pupils: Pupils are equal, round, and reactive to light.  Cardiovascular:     Rate and Rhythm: Normal rate and regular rhythm.     Heart sounds: Normal heart sounds.  Pulmonary:     Effort: Pulmonary effort is normal.     Breath sounds: Normal breath sounds.  Musculoskeletal:     Cervical back: Normal range of motion and neck supple.  Lymphadenopathy:     Cervical: No cervical adenopathy.  Skin:    General: Skin is warm and dry.  Neurological:     General: No focal deficit present.     Mental Status: She is alert and oriented to person, place, and time.  Psychiatric:        Mood and Affect: Mood normal.        Behavior: Behavior normal.      UC Treatments / Results  Labs (all labs ordered are listed, but only abnormal results are displayed) Labs Reviewed  CULTURE, GROUP A STREP [facility])  POCT RAPID STREP A (OFFICE)   Comprehensive Metabolic Panel Order: [id number] Component Ref Range & Units 11 mo ago  Sodium 135 - 146 MMOL/L 136  Potassium 3.5 - 5.3 MMOL/L 3.8   Chloride 98 - 110 MMOL/L 104  CO2 23 - 30 MMOL/L 23  BUN 8 - 24 MG/DL 10  Glucose 70 - 99 MG/DL 98  Comment: Patients taking eltrombopag at doses >/= 100 mg daily may show falsely elevated values of 10% or greater.  Creatinine 0.50 - 1.50 MG/DL 1.61  Calcium 8.5 - 09.6 MG/DL 9.4  Total Protein 6.0 - 8.3 G/DL 7.1  Comment: Patients taking eltrombopag at doses >/= 100 mg daily may show falsely elevated values of 10% or greater.  Albumin 3.5 - 5.0 G/DL 4.1  Total Bilirubin 0.1 - 1.2 MG/DL 0.5  Comment: Patients taking eltrombopag at doses >/= 100 mg daily may show falsely elevated values of 10% or greater.  Alkaline Phosphatase 25 - 125 IU/L or U/L 53  AST (SGOT) 5 - 40 IU/L or U/L 14  ALT (SGPT) 5 - 50 IU/L or U/L 13  Anion Gap 4 - 14 MMOL/L 9  Est. GFR >=60 ML/MIN/1.73 M*2 >90  Comment: GFR estimated by CKD-EPI equations(NKF 2021).   EKG   Radiology No results found.  Procedures Procedures (including critical care time)  Medications Ordered in UC Medications - No data to display  Initial Impression / Assessment and Plan / UC Course  I have reviewed the triage vital signs and the nursing notes.  Pertinent labs & imaging results that were available during my care of the patient were reviewed by me and considered in my medical decision making (see chart for details).     Reviewed exam and symptoms with patient.  No red flags.  Negative rapid strep, will culture.  Patient declined COVID testing Discussed viral illness and symptomatic treatment.  Lidocaine gargle as needed.  Naproxen as needed for throat pain Salt water gargles and warm liquids PCP follow-up if symptoms do not improve ER precautions reviewed and patient verbalized understanding Final Clinical Impressions(s) / UC Diagnoses   Final diagnoses:  Sore throat  Viral illness     Discharge Instructions      Your strep test was negative.  We will send this for culture and contact you when that  result is available if it is positive Lidocaine gargle as needed for sore throat.  Gargle and spit do not swallow Naproxen twice daily as needed for sore throat Salt water gargles and warm liquids Rest Please follow-up with your PCP if your symptoms do not improve Please go to emergency room if you develop any worsening symptoms Hope you feel better soon!   ED Prescriptions     Medication Sig Dispense Auth. Provider   lidocaine (XYLOCAINE) 2 % solution Use as directed 15 mLs in the mouth or throat every 6 (six) hours as needed (sore throat). Gargle and spit do not swallow 100 mL Radford Pax, NP   naproxen (NAPROSYN) 375 MG tablet Take 1 tablet (375 mg total) by mouth 2 (two) times daily as needed (sore throat). 10 tablet Radford Pax, NP      PDMP not reviewed this encounter.   Radford Pax, NP 05/15/23 1803    Radford Pax, NP 05/15/23 [phone]

## 2023-05-15 NOTE — Discharge Instructions (Signed)
Your strep test was negative.  We will send this for culture and contact you when that result is available if it is positive Lidocaine gargle as needed for sore throat.  Gargle and spit do not swallow Naproxen twice daily as needed for sore throat Salt water gargles and warm liquids Rest Please follow-up with your PCP if your symptoms do not improve Please go to emergency room if you develop any worsening symptoms Hope you feel better soon!

## 2023-05-15 NOTE — ED Triage Notes (Signed)
Pt presents with c/o sore throat x 4 days. Has painful swallowing, nasal congestion and headache.   Home interventions: salt water gargles and lozenges, no help.

## 2023-05-18 LAB — CULTURE, GROUP A STREP (THRC)

## 2023-10-20 ENCOUNTER — Other Ambulatory Visit: Payer: Self-pay | Admitting: Medical Genetics

## 2023-10-20 DIAGNOSIS — Z006 Encounter for examination for normal comparison and control in clinical research program: Secondary | ICD-10-CM

## 2023-11-19 ENCOUNTER — Other Ambulatory Visit (HOSPITAL_COMMUNITY): Payer: Self-pay | Attending: Medical Genetics

## 2023-12-11 ENCOUNTER — Telehealth: Payer: 59 | Admitting: Physician Assistant

## 2023-12-11 DIAGNOSIS — B349 Viral infection, unspecified: Secondary | ICD-10-CM | POA: Diagnosis not present

## 2023-12-11 DIAGNOSIS — Z91199 Patient's noncompliance with other medical treatment and regimen due to unspecified reason: Secondary | ICD-10-CM

## 2023-12-11 NOTE — Progress Notes (Signed)
 Virtual Visit Consent   Jody Singleton, you are scheduled for a virtual visit with a [facility] provider today. Just as with appointments in the office, your consent must be obtained to participate. Your consent will be active for this visit and any virtual visit you may have with one of our providers in the next 365 days. If you have a MyChart account, a copy of this consent can be sent to you electronically.  As this is a virtual visit, video technology does not allow for your provider to perform a traditional examination. This may limit your provider's ability to fully assess your condition. If your provider identifies any concerns that need to be evaluated in person or the need to arrange testing (such as labs, EKG, etc.), we will make arrangements to do so. Although advances in technology are sophisticated, we cannot ensure that it will always work on either your end or our end. If the connection with a video visit is poor, the visit may have to be switched to a telephone visit. With either a video or telephone visit, we are not always able to ensure that we have a secure connection.  By engaging in this virtual visit, you consent to the provision of healthcare and authorize for your insurance to be billed (if applicable) for the services provided during this visit. Depending on your insurance coverage, you may receive a charge related to this service.  I need to obtain your verbal consent now. Are you willing to proceed with your visit today? [name] has provided verbal consent on 12/11/2023 for a virtual visit (video or telephone). Harlene PEDLAR Ward, PA-C  Date: 12/11/2023 6:52 PM  Virtual Visit via Video Note   I, Harlene PEDLAR Ward, connected with  Jody Singleton  ([id number], Jun 30, 1996) on 12/11/23 at  6:45 PM EST by a video-enabled telemedicine application and verified that I am speaking with the correct person using two identifiers.  Location: Patient: Virtual Visit Location Patient:  Home Provider: Virtual Visit Location Provider: Home Office   I discussed the limitations of evaluation and management by telemedicine and the availability of in person appointments. The patient expressed understanding and agreed to proceed.    History of Present Illness: Jody Singleton is a 28 y.o. who identifies as a female who was assigned female at birth, and is being seen today for body aches, fatigue, headache.  No fever, .  Sore throat is worse with swallowing.  Reports mild congestion.  She has been using flonase  with no improvement.  She has also tried Tylenol with temporary relief.  Has an upcoming allergy on Monday.    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   Hematuria 04/14/2017   Pre-diabetes 09/03/2015    Allergies:  Allergies  Allergen Reactions   Penicillins Hives        Medications:  Current Outpatient Medications:    amphetamine-dextroamphetamine (ADDERALL) 5 MG tablet, Take 5 mg by mouth 2 (two) times daily with a meal., Disp: , Rfl:    benzonatate  (TESSALON ) 100 MG capsule, Take 1 capsule (100 mg total) by mouth 3 (three) times daily as needed for cough., Disp: 20 capsule, Rfl: 0   cetirizine  (ZYRTEC ) 10 MG tablet, Take 1 tablet (10 mg total) by mouth daily., Disp: 30 tablet, Rfl: 0   dextromethorphan-guaiFENesin  (MUCINEX  DM) 30-600 MG 12hr tablet, Take 1 tablet by mouth 2 (two) times  daily., Disp: 30 tablet, Rfl: 0   fluticasone  (FLONASE ) 50 MCG/ACT nasal spray, Place 2 sprays into both nostrils daily., Disp: 16 g, Rfl: 0   lidocaine  (XYLOCAINE ) 2 % solution, Use as directed 15 mLs in the mouth or throat every 6 (six) hours as needed (sore throat). Gargle and spit do not swallow, Disp: 100 mL, Rfl: 0   medroxyPROGESTERone (DEPO-PROVERA) 150 MG/ML injection, Inject 150 mg into the muscle every 3 (three) months., Disp: , Rfl:    meloxicam  (MOBIC ) 15 MG tablet, Take 1 tablet (15 mg total)  by mouth daily., Disp: 30 tablet, Rfl: 0   methylPREDNISolone  (MEDROL  DOSEPAK) 4 MG TBPK tablet, Take per packet instructions. Taper dosing., Disp: 1 each, Rfl: 0   naproxen  (NAPROSYN ) 375 MG tablet, Take 1 tablet (375 mg total) by mouth 2 (two) times daily as needed (sore throat)., Disp: 10 tablet, Rfl: 0   ondansetron  (ZOFRAN  ODT) 4 MG disintegrating tablet, Take 1 tablet (4 mg total) by mouth every 8 (eight) hours as needed for nausea or vomiting., Disp: 10 tablet, Rfl: 0   promethazine -dextromethorphan (PROMETHAZINE -DM) 6.25-15 MG/5ML syrup, Take 2.5 mLs by mouth 3 (three) times daily as needed for cough., Disp: 118 mL, Rfl: 0  Observations/Objective: Patient is well-developed, well-nourished in no acute distress.  Resting comfortably  at home.  Head is normocephalic, atraumatic.  No labored breathing.  Speech is clear and coherent with logical content.  Patient is alert and oriented at baseline.    Assessment and Plan: 1. Viral illness (Primary)  Recommend supportive care.  Pt without fever at this time, mild symptoms  Follow Up Instructions: I discussed the assessment and treatment plan with the patient. The patient was provided an opportunity to ask questions and all were answered. The patient agreed with the plan and demonstrated an understanding of the instructions.  A copy of instructions were sent to the patient via MyChart unless otherwise noted below.     The patient was advised to call back or seek an in-person evaluation if the symptoms worsen or if the condition fails to improve as anticipated.    Harlene PEDLAR Ward, PA-C

## 2023-12-11 NOTE — Progress Notes (Signed)
The patient no-showed for appointment despite this provider waiting for at least 10 minutes from appointment time for patient to join. They will be marked as a NS for this appointment/time.   Leeanne Rio, PA-C

## 2023-12-11 NOTE — Patient Instructions (Signed)
 Jody Singleton, thank you for joining Harlene PEDLAR Ward, PA-C for today's virtual visit.  While this provider is not your primary care provider (PCP), if your PCP is located in our provider database this encounter information will be shared with them immediately following your visit.   A [facility] MyChart account gives you access to today's visit and all your visits, tests, and labs performed at [facility]  click here if you don't have a [facility] MyChart account or go to [email or web address]  Consent: (Patient) Jody Singleton provided verbal consent for this virtual visit at the beginning of the encounter.  Current Medications:  Current Outpatient Medications:    amphetamine-dextroamphetamine (ADDERALL) 5 MG tablet, Take 5 mg by mouth 2 (two) times daily with a meal., Disp: , Rfl:    benzonatate  (TESSALON ) 100 MG capsule, Take 1 capsule (100 mg total) by mouth 3 (three) times daily as needed for cough., Disp: 20 capsule, Rfl: 0   cetirizine  (ZYRTEC ) 10 MG tablet, Take 1 tablet (10 mg total) by mouth daily., Disp: 30 tablet, Rfl: 0   dextromethorphan-guaiFENesin  (MUCINEX  DM) 30-600 MG 12hr tablet, Take 1 tablet by mouth 2 (two) times daily., Disp: 30 tablet, Rfl: 0   fluticasone  (FLONASE ) 50 MCG/ACT nasal spray, Place 2 sprays into both nostrils daily., Disp: 16 g, Rfl: 0   lidocaine  (XYLOCAINE ) 2 % solution, Use as directed 15 mLs in the mouth or throat every 6 (six) hours as needed (sore throat). Gargle and spit do not swallow, Disp: 100 mL, Rfl: 0   medroxyPROGESTERone (DEPO-PROVERA) 150 MG/ML injection, Inject 150 mg into the muscle every 3 (three) months., Disp: , Rfl:    meloxicam  (MOBIC ) 15 MG tablet, Take 1 tablet (15 mg total) by mouth daily., Disp: 30 tablet, Rfl: 0   methylPREDNISolone  (MEDROL  DOSEPAK) 4 MG TBPK tablet, Take per packet instructions. Taper dosing., Disp: 1 each, Rfl: 0   naproxen  (NAPROSYN ) 375 MG tablet, Take 1 tablet (375 mg total) by mouth 2  (two) times daily as needed (sore throat)., Disp: 10 tablet, Rfl: 0   ondansetron  (ZOFRAN  ODT) 4 MG disintegrating tablet, Take 1 tablet (4 mg total) by mouth every 8 (eight) hours as needed for nausea or vomiting., Disp: 10 tablet, Rfl: 0   promethazine -dextromethorphan (PROMETHAZINE -DM) 6.25-15 MG/5ML syrup, Take 2.5 mLs by mouth 3 (three) times daily as needed for cough., Disp: 118 mL, Rfl: 0   Medications ordered in this encounter:  No orders of the defined types were placed in this encounter.    *If you need refills on other medications prior to your next appointment, please contact your pharmacy*  Follow-Up: Call back or seek an in-person evaluation if the symptoms worsen or if the condition fails to improve as anticipated.  [facility] [phone]  Other Instructions Drink plenty of fluids, rest.  Can take Mucinex  for congestion.  Recommend Tylenol or Ibuprofen  as needed for body aches and headaches.    If you have been instructed to have an in-person evaluation today at a local Urgent Care facility, please use the link below. It will take you to a list of all of our available [facility], including address, phone number and hours of operation. Please do not delay care.  [facility]  If you or a family member do not have a primary care provider, use the link below to schedule a visit and establish care. When you choose a [facility] primary care physician or  advanced practice provider, you gain a long-term partner in health. Find a Primary Care Provider  Learn more about [facility] in-office and virtual care options: [facility] Get Care Now

## 2024-03-31 ENCOUNTER — Other Ambulatory Visit: Payer: Self-pay | Admitting: Physician Assistant

## 2024-03-31 DIAGNOSIS — M25562 Pain in left knee: Secondary | ICD-10-CM

## 2024-04-06 ENCOUNTER — Ambulatory Visit
Admission: RE | Admit: 2024-04-06 | Discharge: 2024-04-06 | Disposition: A | Discharge status: Home / Self Care | Source: Ambulatory Visit | Attending: Physician Assistant | Admitting: Physician Assistant

## 2024-04-06 DIAGNOSIS — M25562 Pain in left knee: Secondary | ICD-10-CM

## 2024-08-10 ENCOUNTER — Telehealth: Admitting: Family Medicine

## 2024-08-10 DIAGNOSIS — B9689 Other specified bacterial agents as the cause of diseases classified elsewhere: Secondary | ICD-10-CM

## 2024-08-10 DIAGNOSIS — N760 Acute vaginitis: Secondary | ICD-10-CM | POA: Diagnosis not present

## 2024-08-10 MED ORDER — METRONIDAZOLE 500 MG PO TABS: 500.0000 mg | ORAL_TABLET | Freq: Two times a day (BID) | ORAL | 0 refills | Status: AC

## 2024-08-10 NOTE — Progress Notes (Signed)

## 2024-09-09 ENCOUNTER — Other Ambulatory Visit: Payer: Self-pay | Admitting: Medical Genetics

## 2024-09-09 DIAGNOSIS — Z006 Encounter for examination for normal comparison and control in clinical research program: Secondary | ICD-10-CM

## 2024-09-25 ENCOUNTER — Encounter
